# Patient Record
Sex: Female | Born: 1975 | Race: Black or African American | Hispanic: No | Marital: Married | Smoking: Former smoker
Health system: Southern US, Community
[De-identification: ages and names within clinical notes are randomized; demographics above are authoritative.]

## PROBLEM LIST (undated history)

## (undated) DIAGNOSIS — N730 Acute parametritis and pelvic cellulitis: Secondary | ICD-10-CM

## (undated) DIAGNOSIS — M25561 Pain in right knee: Secondary | ICD-10-CM

## (undated) HISTORY — DX: Acute parametritis and pelvic cellulitis: N73.0

## (undated) HISTORY — PX: ECTOPIC PREGNANCY SURGERY: SHX613

## (undated) HISTORY — PX: BREAST SURGERY: SHX581

## (undated) HISTORY — DX: Pain in right knee: M25.561

## (undated) HISTORY — PX: PARTIAL HYSTERECTOMY: SHX80

---

## 2003-08-17 ENCOUNTER — Emergency Department (HOSPITAL_COMMUNITY): Admission: EM | Admit: 2003-08-17 | Discharge: 2003-08-17 | Payer: Self-pay | Admitting: Emergency Medicine

## 2003-08-17 ENCOUNTER — Encounter: Payer: Self-pay | Admitting: Emergency Medicine

## 2003-09-23 ENCOUNTER — Other Ambulatory Visit: Admission: RE | Admit: 2003-09-23 | Discharge: 2003-09-23 | Payer: Self-pay | Admitting: Obstetrics and Gynecology

## 2004-03-16 ENCOUNTER — Inpatient Hospital Stay (HOSPITAL_COMMUNITY): Admission: AD | Admit: 2004-03-16 | Discharge: 2004-03-18 | Payer: Self-pay | Admitting: *Deleted

## 2004-03-16 IMAGING — US US OB COMP +14 WK
1 series · 13 of 28 positions shown · non-contrast
Comparison: none

CLINICAL DATA: Elevated serum glucose.  Spotty prenatal care.  G1 P0.  LMP [DATE].

[Series 1: unknown · 0.27mm/px · 13 of 40 slices shown]
[im 2/40]
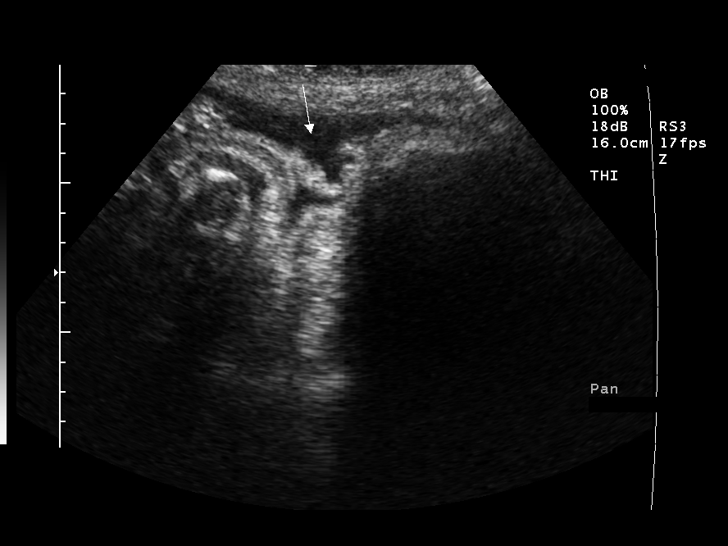
[im 5/40]
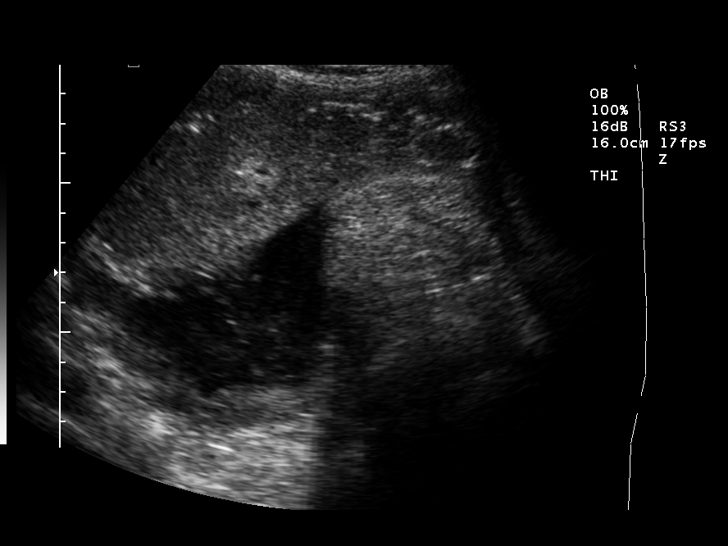
[im 8/40]
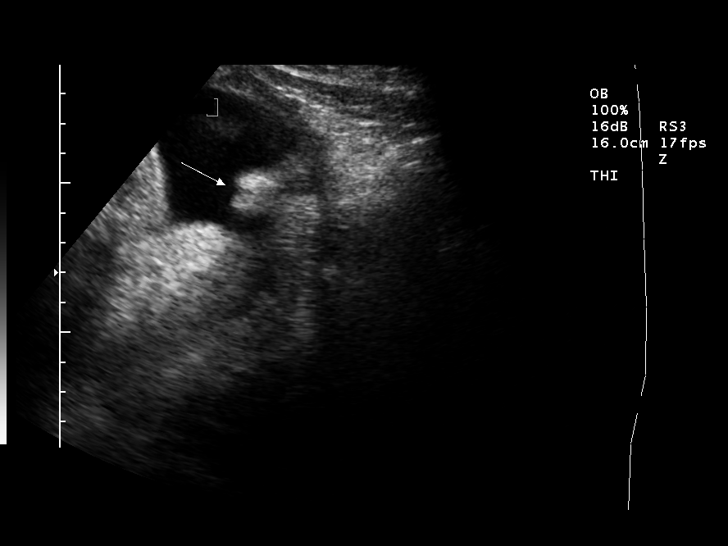
[im 11/40]
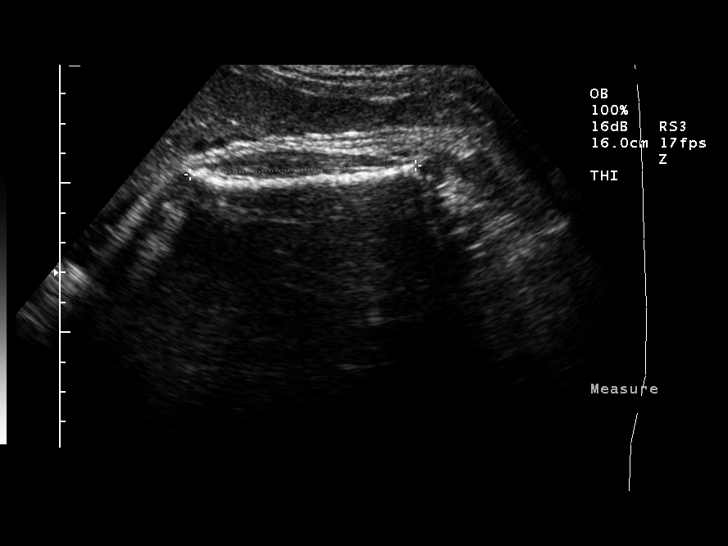
[im 14/40]
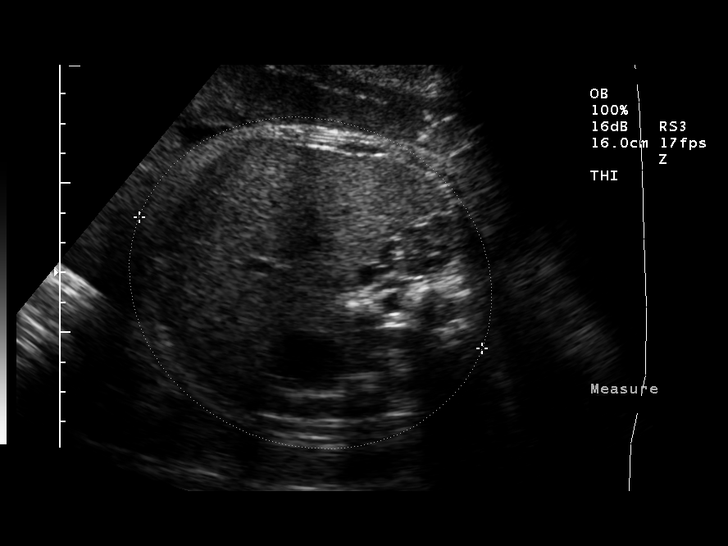
[im 16/40]
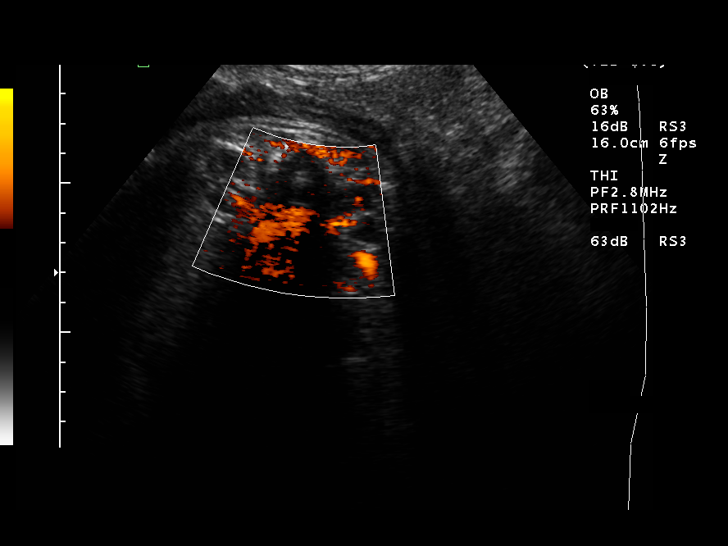
[im 21/40]
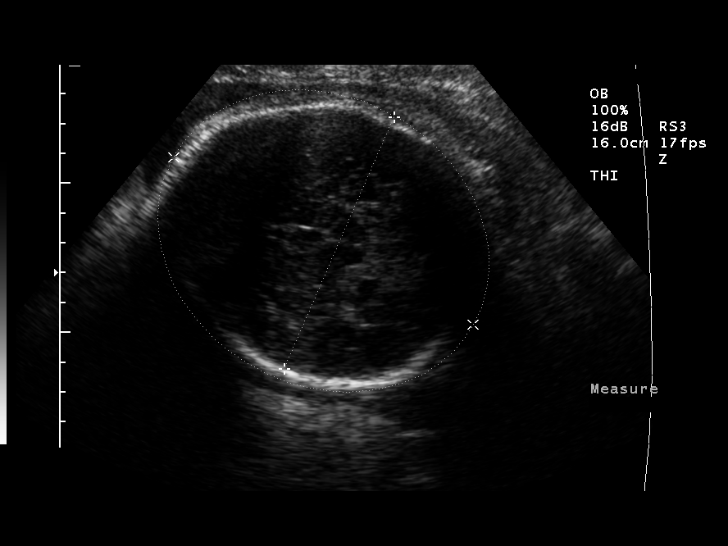
[im 24/40]
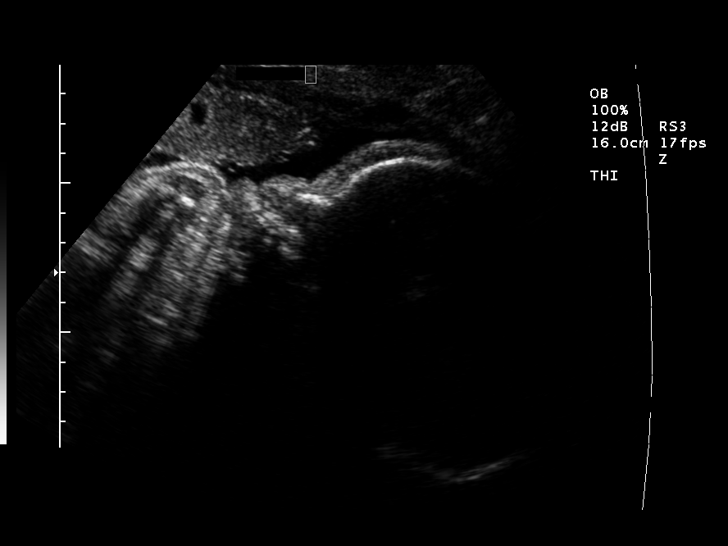
[im 27/40]
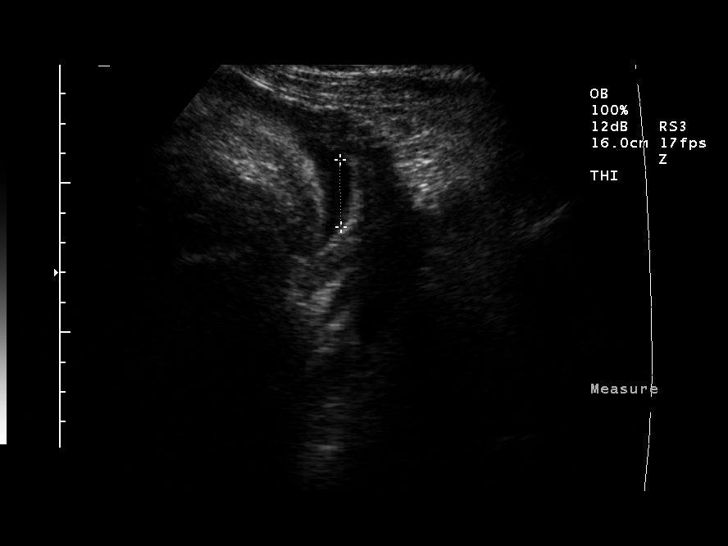
[im 29/40]
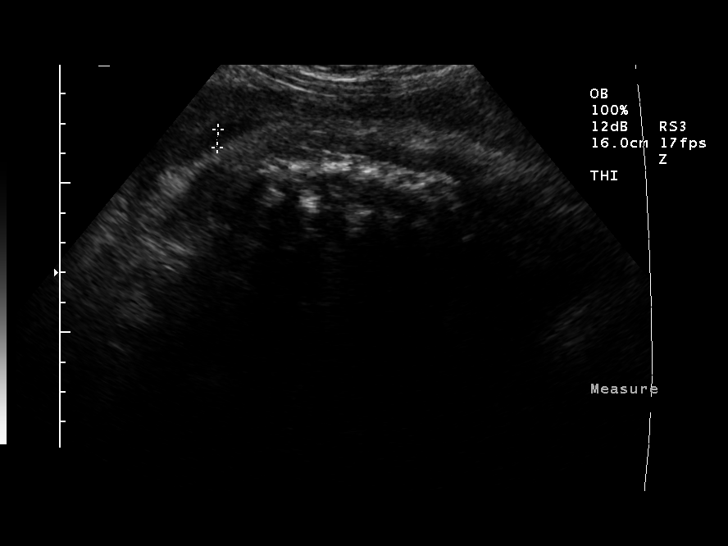
[im 32/40]
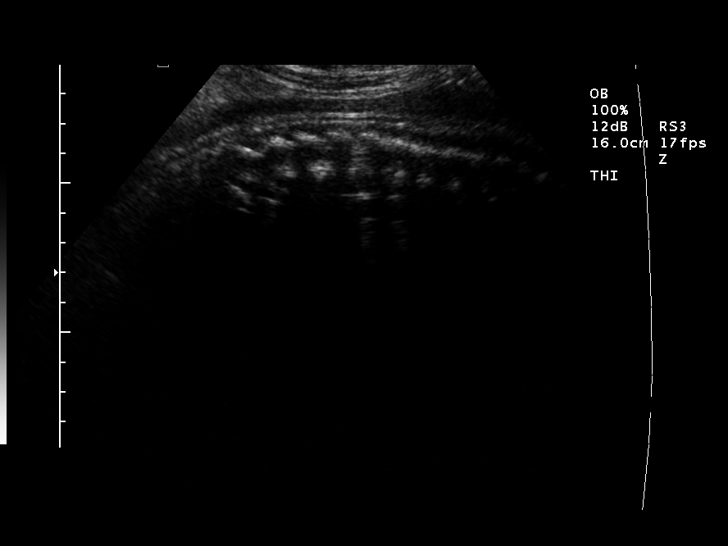
[im 35/40]
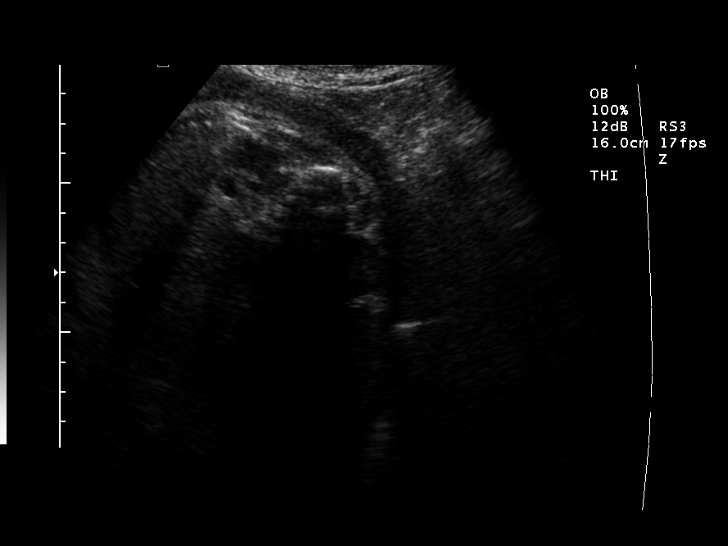
[im 38/40]
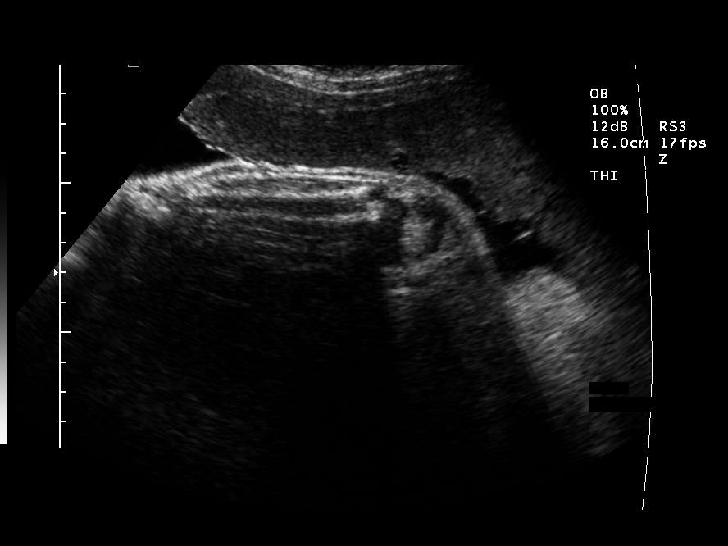

[13 of 28 positions shown; findings below may reference images not displayed]

OBSTETRICAL ULTRASOUND
 Number of Fetuses:  1
 Heart Rate:  162
 Movement:  Yes
 Breathing:  Yes  
 Presentation:  Cephalic
 Placental Location:  Anterior
 Grade:  II
 Previa:  No
 Amniotic Fluid (Subjective):  Normal
 Amniotic Fluid (Objective):   12.5 cm AFI (5th -95th%ile =   7.3 ? 23.9 cm for 38 wks)

 FETAL BIOMETRY
 BPD:   9.2 cm  37 w 3 d
 HC:   32.9 cm  37 w 3 d
 AC:   37.0 cm   41 w 0 d
 FL:    7.5 cm   38 w 1 d

 MEAN GA:  38 w 4 d
 GA BY LMP:    38 w 0 d (assigned)
 EFW:  [LQ] g (H) 90th ? 95th%ile ([LQ] ? [LQ] g) For 38 wks

 FETAL ANATOMY
 Lateral Ventricles:    Not visualized 
 Thalami/CSP:      Not visualized 
 Posterior Fossa:  Not visualized 
 Nuchal Region:    N/A
 Spine:      Visualized 
 4 Chamber Heart on Left:      Visualized 
 Stomach on Left:      Visualized 
 3 Vessel Cord:    Not visualized 
 Cord Insertion site:    Not visualized 
 Kidneys:  Visualized 
 Bladder:  Visualized 
 Extremities:      Limited

 ADDITIONAL ANATOMY VISUALIZED:  Upper lip and female genitalia

 Evaluation limited by:  Fetal position and advanced gestational age 

 MATERNAL FINDINGS
 Cervix: Not evaluated
IMPRESSION: Single living intrauterine fetus in cephalic presentation.  Patient is 38 weeks by LMP dating and measures 38 weeks 4 days today with a fetal weight of [LQ] grams, falling between the 90th and 95th percentile for 38 wks.  Findings raise concern for developing macrosomia.  
 Normal amniotic fluid volume with AFI 12.5 cm. 
 Very limited anatomic survey due to fetal positioning and late gestational age.

 </u12:p>

## 2004-03-22 ENCOUNTER — Encounter: Admission: RE | Admit: 2004-03-22 | Discharge: 2004-03-22 | Payer: Self-pay | Admitting: Obstetrics and Gynecology

## 2004-03-28 ENCOUNTER — Ambulatory Visit (HOSPITAL_COMMUNITY): Admission: RE | Admit: 2004-03-28 | Discharge: 2004-03-28 | Payer: Self-pay | Admitting: Obstetrics and Gynecology

## 2004-03-28 ENCOUNTER — Inpatient Hospital Stay (HOSPITAL_COMMUNITY): Admission: AD | Admit: 2004-03-28 | Discharge: 2004-03-31 | Payer: Self-pay | Admitting: Obstetrics and Gynecology

## 2004-03-28 IMAGING — US US OB FOLLOW-UP
1 series · 13 of 27 positions shown · non-contrast
Comparison: none

CLINICAL DATA: Insulin-dependent diabetes.  Assess fetal growth.

[Series 1: unknown · 0.32mm/px · 13 of 27 slices shown]
[im 2/27]
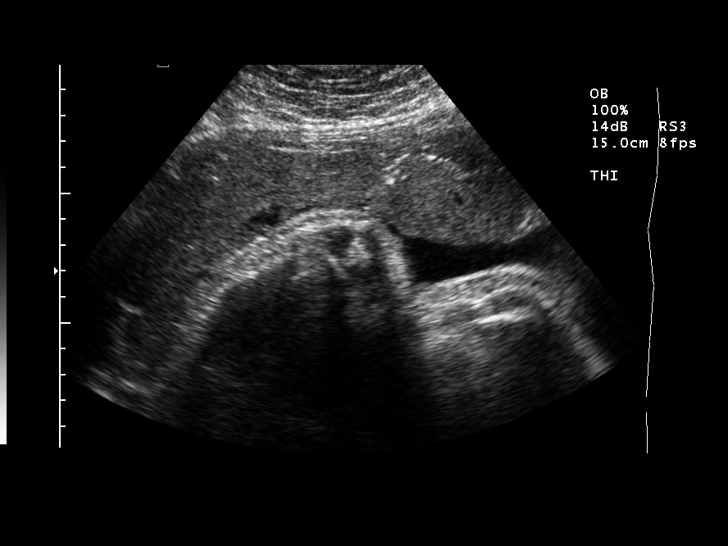
[im 4/27]
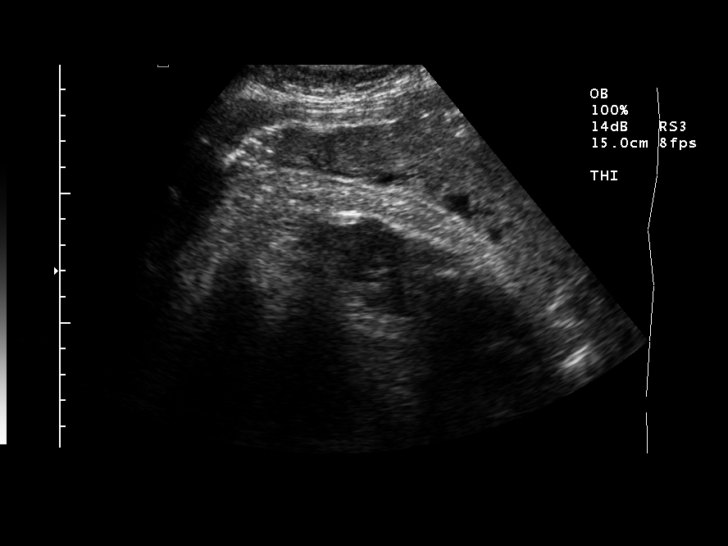
[im 6/27]
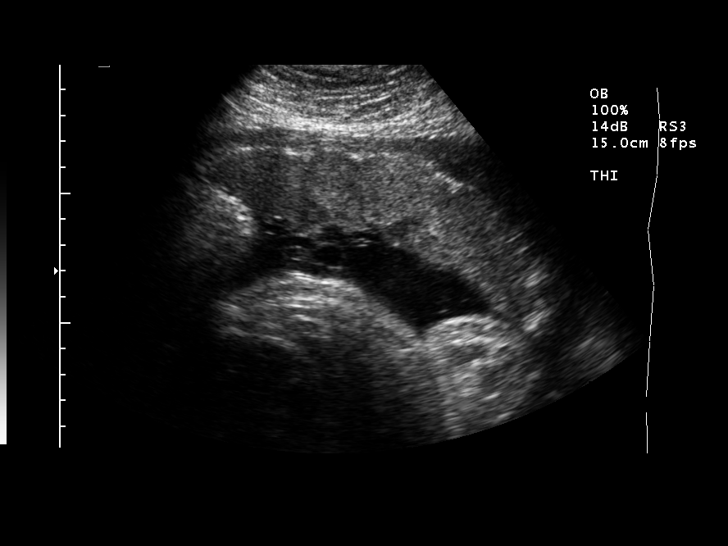
[im 8/27]
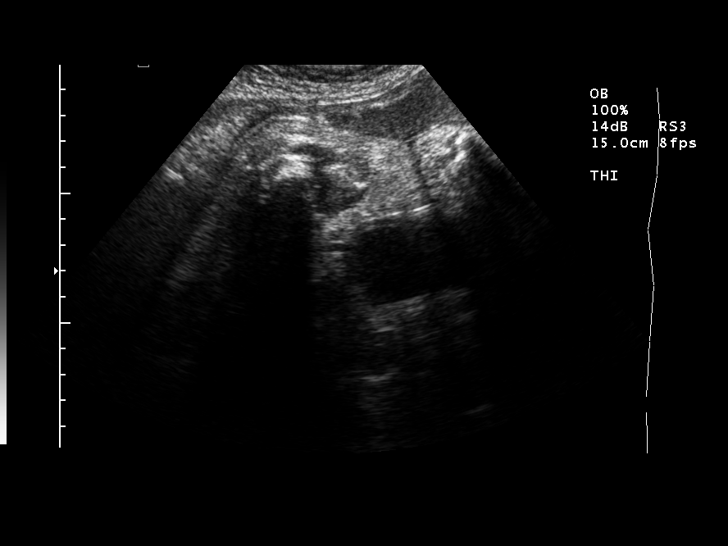
[im 10/27]
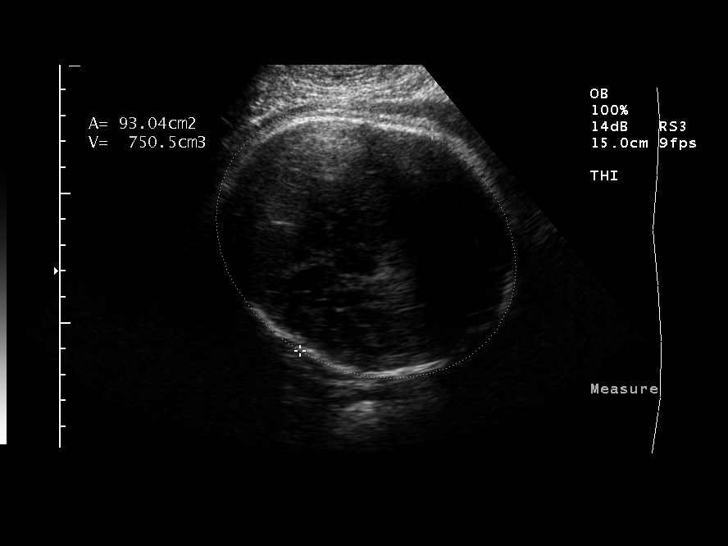
[im 12/27]
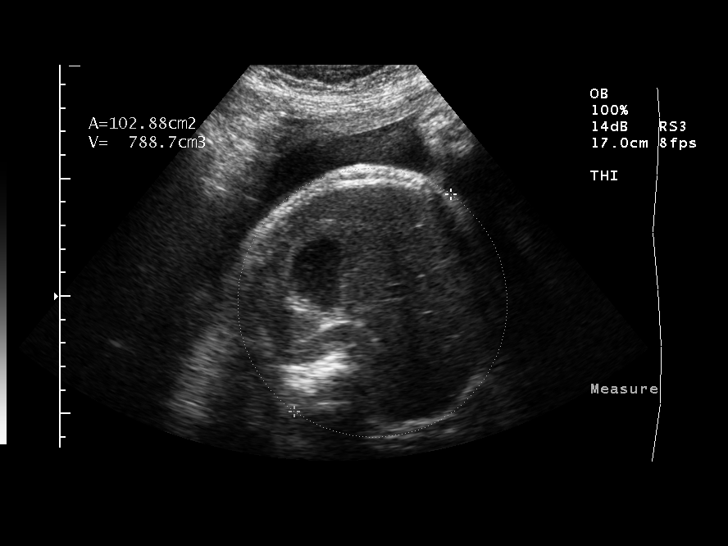
[im 14/27]
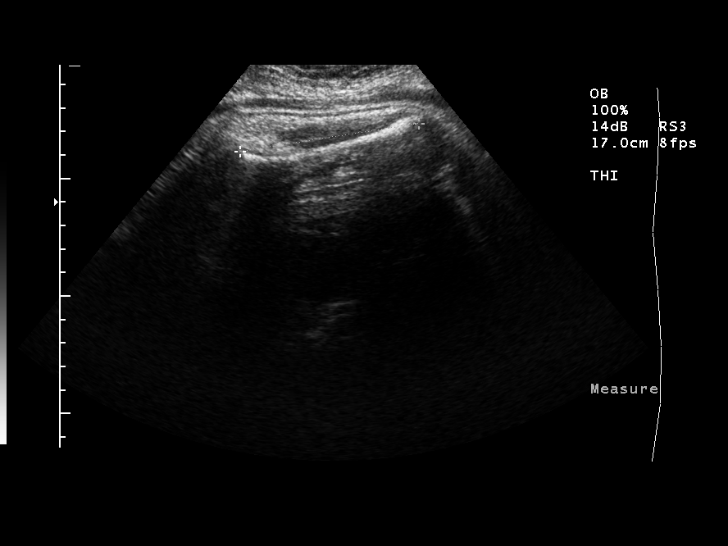
[im 16/27]
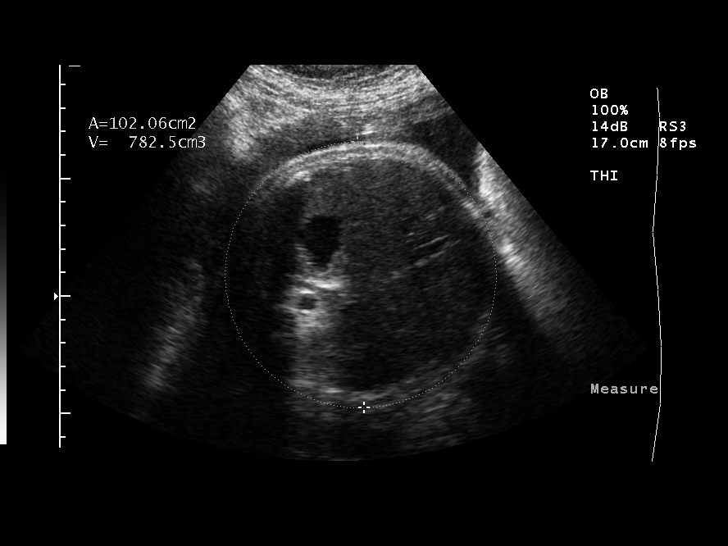
[im 18/27]
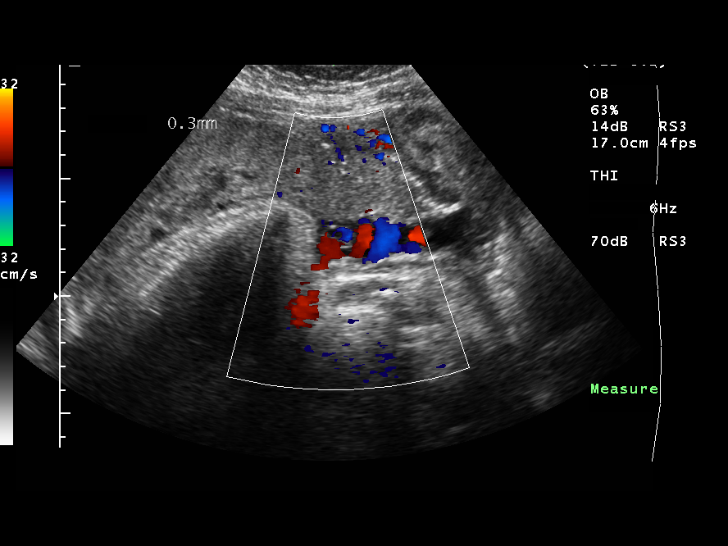
[im 20/27]
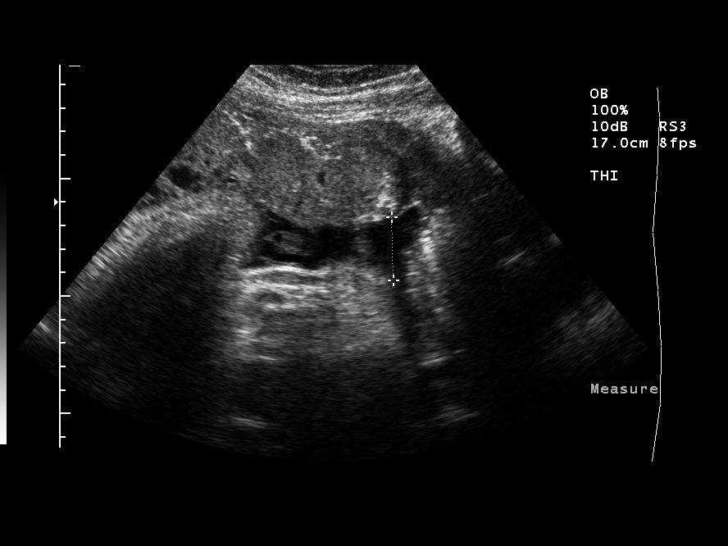
[im 22/27]
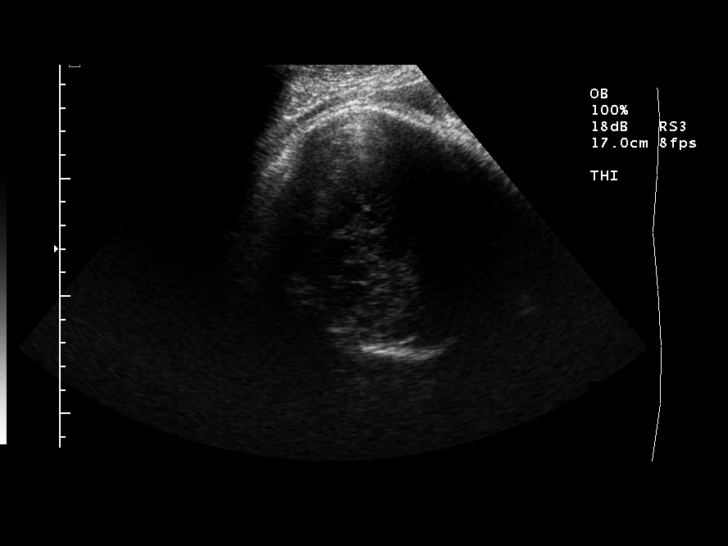
[im 24/27]
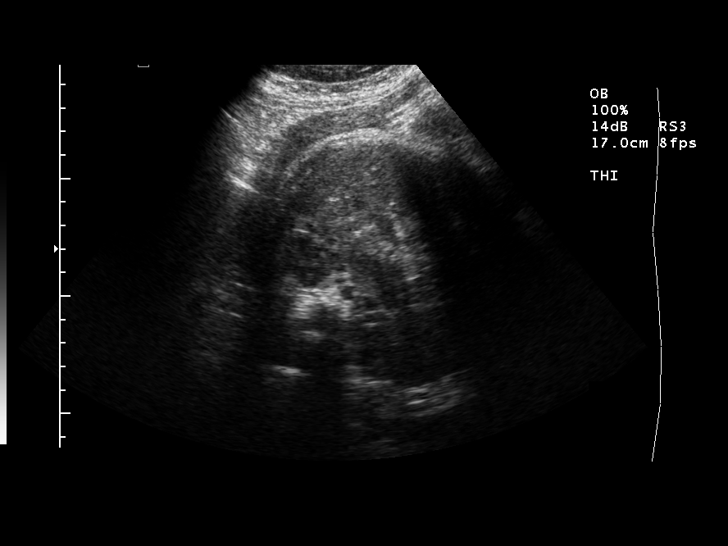
[im 26/27]
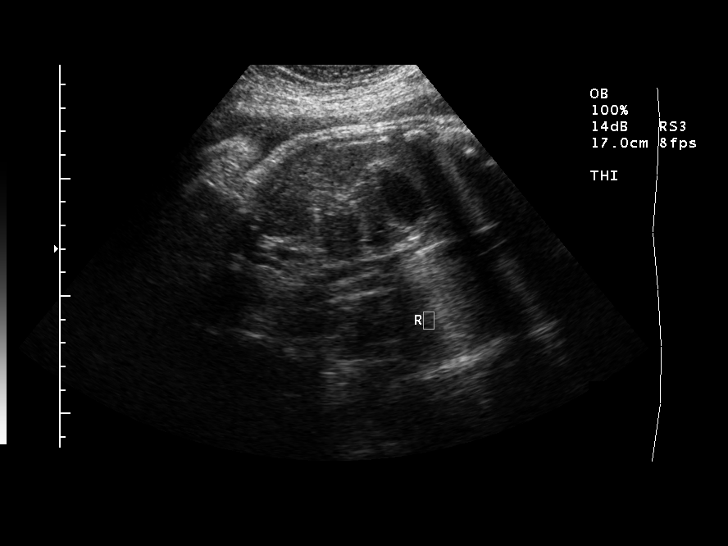

[13 of 27 positions shown; findings below may reference images not displayed]

OBSTETRICAL ULTRASOUND RE-EVALUATION
Number of Fetuses: 1
Heart Rate: 142 BPM
Movement: Yes
Breathing: Yes
Presentation: Vertex
Placental Location: Anterior
Grade: II
Previa: No
Amniotic Fluid (subjective): Decreased
Amniotic Fluid (objective): AFI 6.8 cm ([3Y] %ile = 7.1 to 21.6 cm for 40 weeks)

FETAL BIOMETRY
BPD:9.7 cm, 39W 6D
HC: 34.5 cm, 39W 6D
AC:36.0 cm, 40W 0D
FL: 7.7 cm, 39W 2D
HL:  6.6 cm, 38W 3D
Mean GA: 39W 3D
Fetal indices are within normal limits. 
EFW: [3Y] g (H) [3Y] %ile ([3Y]-[3Y] g) for 40 weeks

FETAL ANATOMY
Lateral Ventricles: not visualized   
Thalami/CSP: not visualized   
Posterior Fossa: not visualized   
Nuchal Region: N/A
Spine: previously seen   
4 Chamber Heart on Left: previously seen 
Stomach on Left: visualized 
3 Vessel Cord: not visualized 
Cord Insertion Site: not visualized 
Kidneys: visualized 
Bladder: visualized 
Extremities: not visualized 

ADDITIONAL ANATOMY VISUALIZED: orbits and diaphragm.

Evaluation limited by: maternal habitus and advanced gestational age.

MATERNAL FINDINGS
Cervix: not evaluated.
IMPRESSION: Single living intrauterine   pregnancy demonstrating an estimated gestational age by ultrasound of 39 weeks and 3 days.  Correlation with expected estimated gestational age by early ultrasound of 39 weeks 5 days suggests appropriate growth.  Today the estimated fetal weight is close to the 75th percentile for a 40-week gestation.  The measurements are felt to be accurate on today?s exam. 
Subjectively and quantitatively slightly decreased amniotic fluid volume.
No late developing fetal anatomic abnormalities are identified associated with the stomach, kidneys, or bladder.  The four-chamber heart view and lateral ventricles could not be assessed due to positioning on today?s exam.
The scan results were called to Dr. CORNIEL?[REDACTED].

## 2005-02-02 ENCOUNTER — Emergency Department (HOSPITAL_COMMUNITY): Admission: EM | Admit: 2005-02-02 | Discharge: 2005-02-02 | Payer: Self-pay | Admitting: Family Medicine

## 2006-02-26 ENCOUNTER — Emergency Department (HOSPITAL_COMMUNITY): Admission: EM | Admit: 2006-02-26 | Discharge: 2006-02-26 | Payer: Self-pay | Admitting: Emergency Medicine

## 2006-11-20 HISTORY — PX: TUBAL LIGATION: SHX77

## 2007-05-22 ENCOUNTER — Inpatient Hospital Stay (HOSPITAL_COMMUNITY): Admission: AD | Admit: 2007-05-22 | Discharge: 2007-05-22 | Payer: Self-pay | Admitting: Obstetrics and Gynecology

## 2007-08-12 ENCOUNTER — Inpatient Hospital Stay (HOSPITAL_COMMUNITY): Admission: AD | Admit: 2007-08-12 | Discharge: 2007-08-15 | Payer: Self-pay | Admitting: Obstetrics and Gynecology

## 2007-10-29 ENCOUNTER — Ambulatory Visit (HOSPITAL_COMMUNITY): Admission: RE | Admit: 2007-10-29 | Discharge: 2007-10-29 | Payer: Self-pay | Admitting: Obstetrics and Gynecology

## 2011-04-04 NOTE — Op Note (Signed)
NAME:  Diana Gray, Diana Gray NO.:  [id number]   MEDICAL RECORD NO.:  [id number]          PATIENT TYPE:  AMB   LOCATION:  SDC                           [facility]   PHYSICIAN:  Osborn Coho, M.D.   DATE OF BIRTH:  02-25-1976   DATE OF PROCEDURE:  10/29/2007  DATE OF DISCHARGE:                               OPERATIVE REPORT   PREOPERATIVE DIAGNOSIS:  Permanent sterilization.   POSTOPERATIVE DIAGNOSIS:  Permanent sterilization.   PROCEDURE:  Laparoscopic bilateral tubal sterilization.   SURGEON:  Osborn Coho, M.D.   ANESTHESIA:  General anesthesia.   FINDINGS:  Normal-appearing bilateral ovaries and fallopian tubes.   FLUIDS:  1500 mL.   URINE OUTPUT:  Greater than 500 mL via straight cath prior to procedure.   ESTIMATED BLOOD LOSS:  Minimal.   COMPLICATIONS:  None.   DESCRIPTION OF PROCEDURE:  The patient was taken to the operating room  where, after the risks, benefits, and alternatives were reviewed with  the patient, patient verbalized understanding and consent signed and  witnessed.  The patient understands that this is a permanent procedure  and declines alternatives and understands that there is a risk of  failure with the possibility of ectopic pregnancy.  The patient was  prepped and draped in normal sterile fashion after being placed under  general anesthesia.  The patient was in the dorsal lithotomy position  and the bladder was emptied with a Foley.  A bivalve speculum was placed  in the patient's vagina and a Hulka placed for intrauterine  manipulation.  Attention was then turned to the abdomen where a 10 mm  incision was made at the umbilicus and Veress needle  introduced into  the intra-abdominal cavity without difficulty.  Pneumoperitoneum  achieved.  A 10 mm trocar was then advanced into the intra-abdominal  cavity and laparoscope introduced.  Fitz/Hugh/Curtis noticed.  Fallopian  tubes were carried out to their fimbriated ends and  cauterized for at  least three consecutive burns in the isthmic region bilaterally.  Pneumoperitoneum was then relieved.  Trocar removed under  direct visualization.  The fascia was repaired with a 0 Vicryl via a  running stitch.  The skin was reapproximated using 3-0 Monocryl via a  subcuticular stitch.  Sponge, lap and needle counts correct.  The  patient tolerated the procedure well and was awaiting transfer to  recovery room in good condition.      Osborn Coho, M.D.  Electronically Signed     AR/MEDQ  D:  10/29/2007  T:  10/29/2007  Job:  [id number]

## 2011-04-04 NOTE — H&P (Signed)
Diana Gray, BRANAN NO.:  [id number]   MEDICAL RECORD NO.:  [id number]          PATIENT TYPE:  INP   LOCATION:  9163                          [facility]   PHYSICIAN:  Janine Limbo, M.D.DATE OF BIRTH:  04-29-1976   DATE OF ADMISSION:  08/12/2007  DATE OF DISCHARGE:                              HISTORY & PHYSICAL   Diana Gray is a 35 year old, single black female, gravida 2, para 1,  0, 0, 1, at [redacted] weeks gestation who presents for induction of labor.  She  denies contractions, leaking or bleeding, no signs and symptoms of PIH.  Her pregnancy has been followed by the [facility] MD  [facility] and has been remarkable for:  1. History of gestational diabetes.  2. History of STDs.  3. Smoker.  4. Positive RPR.  5. Elevated body mass index.  6. History of oligohydramnios.  7. Group B strep positive.   Her prenatal labs were collected on February 07, 2007.  Hemoglobin 13.4,  hematocrit 40, platelets 386,000.  Blood type 0 positive, antibody  negative, sickle cell trait negative, RPR reactive, rubella immune,  hepatitis B surface antigen negative, HIV nonreactive, gonorrhea  negative, chlamydia positive.  Pap smear within normal limits.  TPPA  reactive, titer of 1-2.  One hour Glucola from Apr 08, 2007 was 100.  RPR at that time was nonreactive.  GC and chlamydia from May 02, 2007  were both negative.  Hemoglobin A1c from May 02, 2007 was 6.  Group B  strep from June 01, 2007 was positive.  Chlamydia from that same date  was also positive.  One hour Glucola from June 03, 2007 was 15.  GC and  chlamydia from July 15, 2007 were both negative.   HISTORY OF PRESENT PREGNANCY:  The patient presented for care at [facility] on February 07, 2007 at [redacted] weeks gestation.  The patient started  monitoring CBGs at that point due her history of gestational diabetes  with her first pregnancy.  The patient also a positive RPR with history  of positive  RPR in 2005.  The patient had positive chlamydia at that new  OB visit and she was treated at that time.  Anatomy ultrasound at [redacted]  weeks gestation shows growth not consistent with previous dating with  best [facility] as August 19, 2007.  Test of cure for chlamydia was done on  Apr 08, 2007 at [redacted] weeks gestation and was negative.  The patient had an  early Glucola that was also negative as well as a Glucola at 29 weeks  that was negative.  The patient was tested for group B strep which was  positive around 28 weeks.  She also was positive for chlamydia again at  28 weeks.  She was treated and test of cures were negative from July 18, 2007.  The rest of her prenatal care has been unremarkable.  She has  remained normotensive and most recently was stating that she was tired  of being pregnant and was requesting induction of labor.   OB HISTORY:  She is gravida 2,  para 1, 0, 0, 1.  In May of 2005 she had  a vaginal delivery of a female infant weighing 8 pounds 6 ounces at 27-  5/[redacted] weeks gestation after 5 hours in labor.  She had an epidural for  anesthesia.  She had gestational diabetes with that pregnancy.  There  were no complications with the delivery.  A second pregnancy was with a  different paternity.   PAST MEDICAL HISTORY:  She has no medication allergies.  She experienced  menarche at the age of 83 with 28 day cycles lasting 4 days.  She had  oligo, gestational diabetes with her first pregnancy.  She was treated  for gonorrhea in September 2004 as well as Trichomonas.  She reports  having had the usual childhood illnesses.  She has had a history of  positive RPR.  She has had cystitis times 1.   PAST SURGICAL HISTORY:  Negative.   FAMILY MEDICAL HISTORY:  Father with MI, father with chronic  hypertension.  Sister with anemia.  Father and maternal uncle with  diabetes.  Sister with history of migraines.  Genetic history is  negative.   SOCIAL HISTORY:  The patient is single.   Father of the baby is not  involved.  She has 10 years of education and is employed full time as a  Advertising copywriter.  She denies any alcohol or illicit drug use with the  pregnancy although she smoked approximately 4 cigarettes per day during  the pregnancy.   OBJECTIVE:  VITAL SIGNS:  Stable, she is afebrile.  HEENT:  Grossly within normal limits.  CHEST:  Clear to auscultation.  HEART:  Regular rate and rhythm.  ABDOMEN:  Gravid and contoured with fundal height extending  approximately 39 cm above the pubic symphysis.  Fetal heart rate is  reactive and reassuring.  Contractions are occasional and mild.  Cervix  was 1 cm per her last office exam.  Vertex was confirmed by bedside  ultrasound tonight.  EXTREMITIES:  Within normal limits.   ASSESSMENT:  1. Intrauterine pregnancy at term.  2. Unfavorable cervix.   PLAN:  1. Admit to birthing suites.  2. Routine M.D. orders.  3. Plan cervical ripening tonight followed by pitocin in the morning.      [name], C.N.M.      Janine Limbo, M.D.  Electronically Signed    KS/MEDQ  D:  08/12/2007  T:  08/13/2007  Job:  [id number]

## 2011-04-07 NOTE — H&P (Signed)
NAME:  Diana Gray, HUDMAN                         ACCOUNT NO.:  [id number]   MEDICAL RECORD NO.:  [id number]                   PATIENT TYPE:  INP   LOCATION:  9149                                 [facility]   PHYSICIAN:  Naima A. Dillard, M.D.              DATE OF BIRTH:  12/24/1975   DATE OF ADMISSION:  DATE OF DISCHARGE:                                HISTORY & PHYSICAL   HISTORY OF PRESENT ILLNESS:  Diana Gray is a 35 year old gravida 1 para 0  at 38 weeks by first trimester ultrasound who was seen at the office today  with a fasting blood sugar of 269.  The patient's care had been remarkable  for being intermittent, with transferring back and forth from [place]  [facility] to [place].  She had had 6-7 visits total for her prenatal care  with four of those being at [facility].  She was seen at our office  on March 09, 2004 with a blood sugar of 364 after drinking two sodas so she  was brought back in today for a fasting blood sugar of 269.  She is  therefore to be admitted to [facility] for diabetic management,  complete OB ultrasound, and monitoring.  Pregnancy has been remarkable for:  1. Intermittent prenatal care.  2. First trimester chlamydia which was treated with negative test of cure.  3. LATEX allergy.  4. Positive RPR with treatment in early January, now with a nonreactive RPR.  5. Smoker.  6. Positive group B strep.  7. Current yeast infection.  8. Questionable history of vulvar blisters last week.   PRENATAL LABORATORY DATA:  Blood type is O positive, Rh antibody negative.  VDRL was reactive in January.  She was treated at that time.  She had repeat  testing on April 7 which showed a titer of 4.  She had another titer done on  April 13 which was nonreactive.  Hepatitis B was negative.  HIV was  negative.  GC and chlamydia cultures were negative at 36 weeks.  Positive  beta strep culture was noted at 36 weeks.  Glucose challenge was noted at  her  other office in [place] to be negative at 105; this was done at 28 weeks at  her other office.  EDC of Mar 30, 2004 was established by ultrasound that  was done on August 17, 2003.  The patient also had a yeast infection  diagnosed today.   HISTORY OF PRESENT PREGNANCY:  The patient entered care at [facility] at 13 weeks.  She had bacterial vaginosis diagnosed at her first visit.  From that visit on November 3 we did not see her again until December 23.  She reported she had moved to [place].  On December 23 she walked in  unannounced but reports she decided not to move.  From her visit on December  23 she was not seen  again until April 7.  She reported she had moved back to  [place].  She had had records transferred showing a normal 1-hour Glucola on  February 24.  She had also had an ultrasound on March 2 that showed an  estimated fetal weight between 2166 g and 2254 g at 33 weeks with an [facility] of  Mar 26, 2004.  Upon return to [facility] she was noted to have had a  positive RPR titer in January and she did receive three penicillin  injections.  RPR was sent on April 7 showing a titer of 4.  GC, chlamydia,  and group B strep culture were also collected on April 7 and she also had a  positive wet prep for bacterial vaginosis.  On her follow-up visit on April  13 she had a negative RPR.  She then was seen again on April 20.  She had 3+  glycosuria that day and had a Dextrostix of 364.  She reported she drank two  sodas on the way to the office.  She was then scheduled for a fasting blood  sugar today which was 269.  She also reported per telephone call on April 21  severe vaginal irritation with blisters.  She was recommended to be seen;  however, she declined due to transportation.  She was not seen again until  today on April 27 with the previously-noted issues.   OBSTETRICAL HISTORY:  The patient is a primigravida.   MEDICAL HISTORY:  In October 2004 she was treated for  chlamydia.  At the  same time she was treated for Trichomonas.  She does have a history of yeast  infections.  She is allergic to PRENATAL VITAMINS which cause a rash, and  LATEX GLOVES.  She has had one history of UTIs.   FAMILY HISTORY:  Her father had an MI.  He has also had hypertension and  diabetes.  She also reports there is diabetes on the other side of her  family.  Her sister has anemia.  Her sister also has renal failure and is on  dialysis.  Her sister has some type of cancer.  Her father is a smoker.   GENETIC HISTORY:  Unremarkable.   SOCIAL HISTORY:  The patient is single.  The father of the baby is  intermittently involved.  The patient has a 10-year education.  She is  employed at a nursing home.  The father of the baby has a 12-year education.  He is unemployed.  The patient has been followed by the certified nurse  midwife service but she is now to be transferred to the physician service  secondary to gestational diabetes.  The patient denies any alcohol, drug, or  tobacco use during this pregnancy.   PHYSICAL EXAMINATION:  VITAL SIGNS:  Stable, blood pressure is 120/80,  weight today is 263 pounds for a total of 19 pounds weight gain.  HEENT:  Within normal limits.  LUNGS:  Bilateral breath sounds are clear.  HEART:  Regular rate and rhythm without murmur.  BREAST:  Soft and nontender.  ABDOMEN:  Fundal height is approximately 37 cm.  Estimated fetal weight is 6  to 6.5 pounds.  There are no contractions palpated.  Fetal heart rate is in  the 150s by Doppler with audible accelerations.  PELVIC:  There is copious greenish-yellow discharge adherent to the perineum  and the vaginal walls.  This shows positive yeast.  There is no evidence of  any blisters or  lesions on the perineum or anal area or intravaginally or on  the cervix.  Cervix is posterior, closed, 50%, vertex at a -1 station.  EXTREMITIES:  Deep tendon reflexes are 2+ without clonus.  There is a  trace edema noted.   Urine sample today shows 3+ glucose and 2+ ketones.   IMPRESSION:  1. Intrauterine pregnancy at 38 weeks.  2. Irregular prenatal care.  3. Gestational diabetes with late diagnosis.  4. LATEX ALLERGY.  5. First trimester chlamydia with a negative test of cure.  6. Positive beta streptococcus.  7. Current yeast infection.  8. History of positive RPR with most recent nonreactive test.   PLAN:  1. Admit to the [facility] per consult with Dr. Jaymes Graff as attending physician.  2. Plan glucose monitoring per Dr. Redmond Baseman preference.  3. Complete OB ultrasound for size, dates, and fluid.  4. Weigh daily.  5. A 2200 calorie ADA diet.  6. Dr. Normand Sloop will determine further plan of care.     Renaldo Reel Emilee Hero, C.N.M.                   Naima A. Normand Sloop, M.D.    Leeanne Mannan  D:  03/16/2004  T:  03/16/2004  Job:  [id number]

## 2011-04-07 NOTE — H&P (Signed)
NAME:  Diana Gray, Diana Gray NO.:  [id number]   MEDICAL RECORD NO.:  [id number]                   PATIENT TYPE:  INP   LOCATION:  9166                                 [facility]   PHYSICIAN:  Janine Limbo, M.D.            DATE OF BIRTH:  May 06, 1976   DATE OF ADMISSION:  03/28/2004  DATE OF DISCHARGE:                                HISTORY & PHYSICAL   HISTORY OF PRESENT ILLNESS:  This is a 35 year old, gravida 1, para 0, at 66-  5/7 weeks, who presents for elective induction of labor secondary to  gestational diabetes and new onset of oligohydramnios.  She was originally  scheduled for tomorrow for induction.  Her pregnancy has been followed by  the nurse midwife service and now by the M.D. service and remarkable for:  1.  Unsure dates.  2.  First trimester chlamydia.  3.  Smoker.  4.  Latex  allergy.  5.  Positive RPR (treated).  6.  Gestational diabetes.  7.  Oligohydramnios.  8.  Group B Streptococcus positive.   OBSTETRICAL HISTORY:  The patient is primigravida.   MEDICAL HISTORY:  Remarkable for:  1. Chlamydia in October and Trichomonas in October, both of which were     treated.  2. History of yeast infections.  3. Childhood varicella.   FAMILY HISTORY:  Remarkable for father with MI and hypertension, a sister  with anemia, father with diabetes, a sister with renal failure, and a sister  with an unknown type of cancer.   GENETIC HISTORY:  Unremarkable.   SURGICAL HISTORY:  Negative.   ALLERGIES:  1. PENICILLIN causes a rash.  2. LATEX GLOVES.   SOCIAL HISTORY:  The patient is single.  The father of the baby is Madelin Headings.  She works in a nursing home.  She does not report a religious  affiliation.  She denies alcohol, tobacco, or drug use.  She does report the  use of cigarettes before positive pregnancy test.   PRENATAL LABORATORIES:  Hemoglobin 12.6, platelets 315.  Blood type O  positive.  Antibody screen negative.  RPR  reactive in January.  Rubella  immune.  Hepatitis negative.  HIV negative.  First Glucola 105.  Gonorrhea  negative.  Chlamydia negative.  Then she had a third trimester fasting blood  sugar of 269.   OBJECTIVE DATA:  VITAL SIGNS:  Stable.  Afebrile.  HEENT:  Within normal limits.  NECK:  Thyroid normal.  Not enlarged.  CHEST:  Clear to auscultation.  HEART:  Regular rate and rhythm.  ABDOMEN:  Gravid at 40 cm.  Vertex to Leopold's.  PELVIC:  EMF shows reactive fetal heart rate with no contractions.  Ultrasound shows 50-75% growth with an AFI of 6.8 and a grade 2 placenta.  The cervix is 1 cm, long, -3, soft, and vertex.  EXTREMITIES:  Within normal limits.   ASSESSMENT:  1. Intrauterine pregnancy  at 39-5/7 weeks.  2. Gestational diabetes.  3. Oligohydramnios.  4. History of RPR and chlamydia.  5. Latex allergy.  6. Limited prenatal care.   PLAN:  1. Admit to birthing suites per Dr. Stefano Gaul.  2. Routine M.D. orders.  3. Cytotec induction.  4. Blood sugar monitoring per Dr. Stefano Gaul.     Marie L. Williams, C.N.M.                 Janine Limbo, M.D.    MLW/MEDQ  D:  03/28/2004  T:  03/28/2004  Job:  [id number]

## 2011-04-07 NOTE — H&P (Signed)
NAME:  MIRTIE, BASTYR                         ACCOUNT NO.:  [id number]   MEDICAL RECORD NO.:  [id number]                   PATIENT TYPE:  INP   LOCATION:  9153                                 [facility]   PHYSICIAN:  Naima A. Dillard, M.D.              DATE OF BIRTH:  Mar 20, 1976   DATE OF ADMISSION:  03/16/2004  DATE OF DISCHARGE:                                HISTORY & PHYSICAL   HISTORY OF PRESENT ILLNESS:  Ms. Rayner is a 35 year old, gravida 1, para  0, at 38 weeks.  [facility] Mar 30, 2004 by ultrasound.  She presented to the  office of [facility] today, and was diagnosed with a yeast infection.  Her fasting  blood sugar at that time was greater than 200.  Her blood sugar on admission  today is 338.  She has not eaten yet today.  She did have a 1-hour Glucola,  which was within normal limits.  The patient has had prenatal care here and  in [place], and was just recently diagnosed with diabetes.  She is not having  any contractions, cramping, or pain.  Her baby is moving without any  changes.  She is not having any vaginal bleeding or leaking of fluid.   This patient's prenatal care began at the office of [facility] on September 23, 2003  at approximately [redacted] weeks gestation by ultrasound, and again, her prenatal  care has been incomplete, both here and in [place], and she was seen in October  and in December and in April in our office.  Therefore, her prenatal care  has been limited.   MEDICAL HISTORY:  1. Remarkable for questionable LMP.  2. She had positive Chlamydia treated on August 28, 2003.  Also positive     Trichomonas.  These were both in the first trimester.  3. The patient reports having a positive RPR in January, and received     treatment.  Her RPR will be checked again on admission to the hospital.     Rica Koyanagi, C.N.M.               Naima A. Normand Sloop, M.D.    SDM/MEDQ  D:  03/16/2004  T:  03/16/2004  Job:  [id number]

## 2011-04-07 NOTE — H&P (Signed)
NAME:  Diana Gray, Diana Gray                         ACCOUNT NO.:  [id number]   MEDICAL RECORD NO.:  [id number]                   PATIENT TYPE:  INP   LOCATION:  9149                                 FACILITY:  [facility]   PHYSICIAN:  Naima A. Dillard, M.D.              DATE OF BIRTH:  January 29, 1976   DATE OF ADMISSION:  03/16/2004  DATE OF DISCHARGE:                                HISTORY & PHYSICAL   HISTORY OF PRESENT ILLNESS:  The patient is a 35 year old gravida 1, para 0  at 38 weeks by first trimester ultrasound, who presented to the office today  with a fasting blood sugar of 269.  The patient's prenatal course has been  somewhat limited.  She had begun seeing [place] at [facility] at 13  weeks.  She then had several missed appointments and then had moved out of  town to [place].  She did receive some care in [place], returned to [place] at 20  weeks, but then was not seen from November 12, 2003, until April 7, at 35  weeks when she moved back to [place].  Since that time, she has had a  visit on April 7, April 13, April 20, and today.  At her last visit, she had  a Dextrostix of 364 following 3+ glycosuria.  She did report she had two  sugary sodas on the way to the office, but then she was scheduled for a  fasting today.   She is therefore to be admitted to [facility] for diabetic management.     Renaldo Reel Emilee Hero, C.N.M.                   Naima A. Normand Sloop, M.D.    Diana Gray  D:  03/16/2004  T:  03/16/2004  Job:  [id number]

## 2011-04-07 NOTE — Discharge Summary (Signed)
NAME:  Diana Gray, OSTERMANN                         ACCOUNT NO.:  [id number]   MEDICAL RECORD NO.:  [id number]                   PATIENT TYPE:  INP   LOCATION:  9153                                 [facility]ILITY:  [facility]   PHYSICIAN:  Naima A. Dillard, M.D.              DATE OF BIRTH:  08-09-1976   DATE OF ADMISSION:  03/16/2004  DATE OF DISCHARGE:                                 DISCHARGE SUMMARY   ADMISSION DIAGNOSES:  1. Intrauterine pregnancy at [redacted] weeks gestation.  2. Gestational diabetes requiring insulin and currently out of control.  3. LATEX allergy.  4. Scant prenatal care.  5. Positive group B streptococcus.  6. First trimester chlamydia with negative follow-up.  7. Yeast infection currently.   DISCHARGE DIAGNOSES:  1. Intrauterine pregnancy at [redacted] weeks gestation.  2. Gestational diabetes requiring insulin and currently out of control.  3. LATEX allergy.  4. Scant prenatal care.  5. Positive group B streptococcus.  6. First trimester chlamydia with negative follow-up.  7. Yeast infection currently.  8. Stabilizing diabetes with plan for home care follow-up.   PROCEDURES THIS ADMISSION:  None.   HOSPITAL COURSE:  Diana Gray is a 35 year old single black female gravida  1 para 0 with an EDC of Mar 30, 2004 by first trimester ultrasound who  presented to the office on March 16, 2004 with a fasting blood sugar greater  than 200 and a random blood sugar of 338.  That was even prior to eating on  that day.  Her 1-hour Glucola was within normal range at the time but she  has had scant prenatal care between [facility] and [place], recently moving  back to [place].  She was admitted on March 16, 2004 with this  elevated blood sugar for blood sugar management and has been started on  insulin therapy.  She is also instructed on self-administration of her  insulin and is doing that by herself now.  Her blood sugars are now under  good control and she is felt to be ready for  discharge and the patient  desires discharge today.   DISCHARGE INSTRUCTIONS:  1. She is to check her blood sugars prior to eating in the morning - a     fasting blood sugar, and 2 hours after every meal for a total of four     sticks during the day.  2. She is to give herself 48 units of NPH insulin and 24 units of regular     insulin with her breakfast.  She is to give herself 25 units of regular     insulin with her dinner and 18 units of NPH insulin at bedtime or around     10 p.m.  3. She also has a sliding scale of insulin to give 4 units of regular     insulin for blood sugars of 121-140, 6 units of regular insulin for blood  sugars of 141-160, 8 units of regular insulin for blood sugars of 161-     180, 10 units of regular insulin for blood sugars of 181-200, and if her     blood sugars are greater than 200 she is to call the on-call physician.     If her blood sugars are under 50 she is also to call the on-call     physician.  4. She is to eat a diabetic diet of no greater than 2400 calories a day and     has been instructed per dietician.  5. She is also to call for any signs or symptoms of labor and has also been     instructed in that.   DISCHARGE FOLLOW-UP:  Monday, Mar 21, 2004 at 11:30 a.m. with Dr. Dierdre Forth.  She also has an appointment at [facility] nutrition management  center on Mar 22, 2004 at 8:30 a.m.   DISCHARGE LABORATORY DATA:  Her hemoglobin is 14.5, her wbc count is 9.7,  her platelets are 372.  Her sodium was 135, her BUN is 9, creatinine is 0.7,  and her potassium is 3.6.  Her syphilis is nonreactive.  Uric acid is 6.4,  LDH is 133, SGOT is 20, SGPT is 18.   DISCHARGE STATUS:  Stable.     Concha Pyo. Duplantis, C.N.M.              Naima A. Normand Sloop, M.D.    SJD/MEDQ  D:  03/18/2004  T:  03/18/2004  Job:  [id number]

## 2011-08-28 LAB — CBC
HCT: 40.7
MCHC: 34
MCV: 90.7
WBC: 7.7

## 2011-08-31 LAB — CBC
Hemoglobin: 12.1
MCHC: 34.1
MCV: 90.9
RBC: 3.89
WBC: 10.2
WBC: 10.7 — ABNORMAL HIGH

## 2011-09-05 LAB — URINALYSIS, ROUTINE W REFLEX MICROSCOPIC
Glucose, UA: NEGATIVE
Hgb urine dipstick: NEGATIVE
Ketones, ur: 15 — AB
Nitrite: NEGATIVE
Specific Gravity, Urine: 1.01
Urobilinogen, UA: 0.2

## 2011-09-05 LAB — WET PREP, GENITAL

## 2011-09-05 LAB — STREP B DNA PROBE

## 2011-09-05 LAB — GC/CHLAMYDIA PROBE AMP, GENITAL
Chlamydia, DNA Probe: POSITIVE — AB
GC Probe Amp, Genital: NEGATIVE

## 2011-09-05 LAB — URINE MICROSCOPIC-ADD ON

## 2011-11-21 HISTORY — PX: DILATION AND CURETTAGE OF UTERUS: SHX78

## 2011-11-21 HISTORY — PX: ECTOPIC PREGNANCY SURGERY: SHX613

## 2012-11-20 HISTORY — PX: BREAST SURGERY: SHX581

## 2013-11-20 HISTORY — PX: ECTOPIC PREGNANCY SURGERY: SHX613

## 2013-11-20 HISTORY — PX: DILATION AND CURETTAGE OF UTERUS: SHX78

## 2013-11-20 HISTORY — PX: OVARIAN CYST REMOVAL: SHX89

## 2019-01-03 ENCOUNTER — Encounter (HOSPITAL_COMMUNITY): Payer: Self-pay | Admitting: Emergency Medicine

## 2019-01-03 ENCOUNTER — Ambulatory Visit (HOSPITAL_COMMUNITY)
Admission: EM | Admit: 2019-01-03 | Discharge: 2019-01-03 | Disposition: A | Discharge status: Home / Self Care | Payer: Medicaid Other | Attending: Family Medicine | Admitting: Family Medicine

## 2019-01-03 DIAGNOSIS — M546 Pain in thoracic spine: Secondary | ICD-10-CM | POA: Diagnosis not present

## 2019-01-03 DIAGNOSIS — G8929 Other chronic pain: Secondary | ICD-10-CM

## 2019-01-03 LAB — POCT URINALYSIS DIP (DEVICE)
Bilirubin Urine: NEGATIVE
Glucose, UA: NEGATIVE mg/dL
HGB URINE DIPSTICK: NEGATIVE
KETONES UR: NEGATIVE mg/dL
Nitrite: NEGATIVE
PH: 7 (ref 5.0–8.0)
Protein, ur: NEGATIVE mg/dL
SPECIFIC GRAVITY, URINE: 1.02 (ref 1.005–1.030)
Urobilinogen, UA: 1 mg/dL (ref 0.0–1.0)

## 2019-01-03 MED ORDER — CYCLOBENZAPRINE HCL 5 MG PO TABS: 5.0000 mg | ORAL_TABLET | Freq: Every day | ORAL | 0 refills | Status: DC

## 2019-01-03 MED ORDER — PREDNISONE 20 MG PO TABS: ORAL_TABLET | ORAL | 0 refills | Status: DC

## 2019-01-03 MED ORDER — CEPHALEXIN 500 MG PO CAPS: 500.0000 mg | ORAL_CAPSULE | Freq: Four times a day (QID) | ORAL | 0 refills | Status: DC

## 2019-01-03 NOTE — ED Provider Notes (Signed)
[facility]    CSN: [id number] Arrival date & time: 01/03/19  1215     History   Chief Complaint Chief Complaint  Patient presents with  . Back Pain    HPI Diana Gray is a 43 y.o. female.   Pt here for lower back pain into legs worse with movement.  Has chronic low back pain normal 2/10. Yesterday onset of sharp back pain going up spine 7-8/10 with leg soreness. Denies loss of bowel or bladder function. Reports urinary urgency and frequency without painful urination.      History reviewed. No pertinent past medical history.  There are no active problems to display for this patient.   History reviewed. No pertinent surgical history.  OB History   No obstetric history on file.      Home Medications    Prior to Admission medications   Medication Sig Start Date End Date Taking? Authorizing Provider  cephALEXin (KEFLEX) 500 MG capsule Take 1 capsule (500 mg total) by mouth 4 (four) times daily. 01/03/19   Robyn Haber, MD  cyclobenzaprine (FLEXERIL) 5 MG tablet Take 1 tablet (5 mg total) by mouth at bedtime. 01/03/19   Robyn Haber, MD  predniSONE (DELTASONE) 20 MG tablet One daily with food 01/03/19   Robyn Haber, MD    Family History History reviewed. No pertinent family history.  Social History Social History   Tobacco Use  . Smoking status: Never Smoker  . Smokeless tobacco: Never Used  Substance Use Topics  . Alcohol use: Never    Frequency: Never  . Drug use: Never     Allergies   Patient has no known allergies.   Review of Systems Review of Systems  Constitutional: Negative for chills and fatigue.  Genitourinary: Positive for frequency and urgency. Negative for difficulty urinating and dysuria.  Musculoskeletal: Positive for back pain and gait problem.     Physical Exam Triage Vital Signs ED Triage Vitals  Enc Vitals Group     BP 01/03/19 1243 (!) 140/117     Pulse Rate 01/03/19 1243 83     Resp 01/03/19  1243 18     Temp 01/03/19 1243 98.5 F (36.9 C)     Temp Source 01/03/19 1243 Oral     SpO2 01/03/19 1243 94 %     Weight --      Height --      Head Circumference --      Peak Flow --      Pain Score 01/03/19 1244 6     Pain Loc --      Pain Edu? --      Excl. in [place]? --    No data found.  Updated Vital Signs BP (!) 140/117 (BP Location: Right Arm)   Pulse 83   Temp 98.5 F (36.9 C) (Oral)   Resp 18   SpO2 94%    Physical Exam Constitutional:      General: She is not in acute distress.    Appearance: Normal appearance.  Eyes:     Extraocular Movements: Extraocular movements intact.     Conjunctiva/sclera: Conjunctivae normal.     Pupils: Pupils are equal, round, and reactive to light.  Neck:     Musculoskeletal: Normal range of motion and neck supple.  Cardiovascular:     Rate and Rhythm: Normal rate and regular rhythm.     Pulses: Normal pulses.     Heart sounds: Normal heart sounds.  Abdominal:     Palpations:  Abdomen is soft.     Tenderness: There is abdominal tenderness.     Comments: Suprapubic tenderness   Musculoskeletal:     Comments: Generalized pain to back with palpation  Skin:    General: Skin is warm and dry.  Neurological:     General: No focal deficit present.     Mental Status: She is alert and oriented to person, place, and time.     Cranial Nerves: Cranial nerves are intact.     Sensory: No sensory deficit.     Motor: No weakness.     Coordination: Coordination is intact.     Gait: Gait abnormal.     Comments: Unsteady gait that patient reports due to pain;   Psychiatric:        Mood and Affect: Mood normal.        Behavior: Behavior normal.        Thought Content: Thought content normal.        Judgment: Judgment normal.    Wart on bottom of left foot debrided with No. 15 blade  UC Treatments / Results  Labs (all labs ordered are listed, but only abnormal results are displayed) Labs Reviewed  POCT URINALYSIS DIP (DEVICE) -  Abnormal; Notable for the following components:      Result Value   Leukocytes,Ua SMALL (*)    All other components within normal limits  URINE CULTURE    EKG None  Radiology No results found.  Procedures Procedures (including critical care time)  Medications Ordered in UC Medications - No data to display  Initial Impression / Assessment and Plan / UC Course  I have reviewed the triage vital signs and the nursing notes.  Pertinent labs & imaging results that were available during my care of the patient were reviewed by me and considered in my medical decision making (see chart for details).    Final Clinical Impressions(s) / UC Diagnoses   Final diagnoses:  Chronic bilateral thoracic back pain     Discharge Instructions     One cimetidine or Tagamet daily for a month for the wart  Call [facility] to make appt with Midge Aver, MD    ED Prescriptions    Medication Sig Dispense Auth. Provider   cyclobenzaprine (FLEXERIL) 5 MG tablet Take 1 tablet (5 mg total) by mouth at bedtime. 7 tablet Robyn Haber, MD   predniSONE (DELTASONE) 20 MG tablet One daily with food 10 tablet Robyn Haber, MD   cephALEXin (KEFLEX) 500 MG capsule Take 1 capsule (500 mg total) by mouth 4 (four) times daily. 20 capsule Robyn Haber, MD     Controlled Substance Prescriptions [place] Controlled Substance Registry consulted? Not Applicable   Robyn Haber, MD 01/03/19 1324

## 2019-01-03 NOTE — Discharge Instructions (Addendum)
One cimetidine or Tagamet daily for a month for the wart  Call [facility] to make appt with Midge Aver, MD

## 2019-01-03 NOTE — ED Triage Notes (Signed)
Pt here for lower back pain into legs worse with movement

## 2019-01-04 LAB — URINE CULTURE

## 2019-06-27 ENCOUNTER — Other Ambulatory Visit: Payer: Self-pay

## 2019-06-27 ENCOUNTER — Ambulatory Visit
Admission: EM | Admit: 2019-06-27 | Discharge: 2019-06-27 | Disposition: A | Discharge status: Home / Self Care | Payer: Medicaid Other

## 2019-06-27 DIAGNOSIS — M545 Low back pain, unspecified: Secondary | ICD-10-CM

## 2019-06-27 DIAGNOSIS — M542 Cervicalgia: Secondary | ICD-10-CM

## 2019-06-27 MED ORDER — MELOXICAM 7.5 MG PO TABS: 7.5000 mg | ORAL_TABLET | Freq: Every day | ORAL | 0 refills | Status: DC

## 2019-06-27 MED ORDER — METHOCARBAMOL 500 MG PO TABS: 500.0000 mg | ORAL_TABLET | Freq: Two times a day (BID) | ORAL | 0 refills | Status: DC

## 2019-06-27 NOTE — ED Provider Notes (Signed)
[facility]    CSN: [id number] Arrival date & time: 06/27/19  1016     History   Chief Complaint Chief Complaint  Patient presents with   Back Pain    HPI Diana Gray is a 43 y.o. female.   43 year old female comes in for 2-week history of neck pain, back pain.  Denies injury/trauma.  However, her work requires heavy lifting, manual labor.  Pain is bilateral, right worse than the left.  Neck pain can radiate to the shoulders, but denies numbness, tingling, loss of grip strength.  Denies saddle anesthesia, loss of bladder or bowel control.  She has tried back brace, Tylenol with mild relief.  She has continued to work throughout the past 2 weeks, and states pain has been gradually worsening.  This morning, went to work, and was unable to do heavy lifting due to pain, and therefore came in for evaluation.     History reviewed. No pertinent past medical history.  There are no active problems to display for this patient.   Past Surgical History:  Procedure Laterality Date   BREAST SURGERY     ECTOPIC PREGNANCY SURGERY     PARTIAL HYSTERECTOMY      OB History   No obstetric history on file.      Home Medications    Prior to Admission medications   Medication Sig Start Date End Date Taking? Authorizing Provider  acetaminophen (TYLENOL) 500 MG tablet Take 500 mg by mouth every 6 (six) hours as needed.   Yes [provider]  meloxicam (MOBIC) 7.5 MG tablet Take 1 tablet (7.5 mg total) by mouth daily. 06/27/19   Tasia Catchings, Joeanna Howdyshell V, PA-C  methocarbamol (ROBAXIN) 500 MG tablet Take 1 tablet (500 mg total) by mouth 2 (two) times daily. 06/27/19   Ok Edwards, PA-C    Family History Family History  Problem Relation Age of Onset   Diabetes Mother    Diabetes Father    Heart failure Father     Social History Social History   Tobacco Use   Smoking status: Never Smoker   Smokeless tobacco: Never Used  Substance Use Topics   Alcohol use: Never   Frequency: Never   Drug use: Never     Allergies   Latex   Review of Systems Review of Systems  Reason unable to perform ROS: See HPI as above.     Physical Exam Triage Vital Signs ED Triage Vitals [06/27/19 1030]  Enc Vitals Group     BP 117/79     Pulse Rate 73     Resp 18     Temp 98 F (36.7 C)     Temp src      SpO2 97 %     Weight      Height      Head Circumference      Peak Flow      Pain Score 8     Pain Loc      Pain Edu?      Excl. in [place]?    No data found.  Updated Vital Signs BP 117/79    Pulse 73    Temp 98 F (36.7 C)    Resp 18    SpO2 97%   Visual Acuity Right Eye Distance:   Left Eye Distance:   Bilateral Distance:    Right Eye Near:   Left Eye Near:    Bilateral Near:     Physical Exam Constitutional:  General: She is not in acute distress.    Appearance: She is well-developed. She is not diaphoretic.  HENT:     Head: Normocephalic and atraumatic.  Eyes:     Conjunctiva/sclera: Conjunctivae normal.     Pupils: Pupils are equal, round, and reactive to light.  Neck:     Comments: No tenderness to palpation of spinous processes.  Tenderness to palpation of bilateral neck, max tenderness to the right paraspinal area.  Full range of motion of neck. Cardiovascular:     Rate and Rhythm: Normal rate and regular rhythm.     Heart sounds: Normal heart sounds. No murmur. No friction rub. No gallop.   Pulmonary:     Effort: Pulmonary effort is normal. No accessory muscle usage or respiratory distress.     Breath sounds: Normal breath sounds. No stridor. No decreased breath sounds, wheezing, rhonchi or rales.  Musculoskeletal:     Comments: No tenderness on palpation of the spinous processes.  Tenderness to palpation of bilateral shoulders/trapezius muscle.  Tenderness to palpation of bilateral lumbar region.  No tenderness to palpation of the hips.  Full range of motion of elbow, back, hip.  Right shoulder with decreased active range of  motion due to pain.  Full passive range of motion.  Strength normal and equal bilaterally.  Sensation intact and equal bilaterally.  Negative straight leg raise.    Radial pulse 2+, cap refill less than 2 seconds.    Skin:    General: Skin is warm and dry.  Neurological:     Mental Status: She is alert and oriented to person, place, and time.      UC Treatments / Results  Labs (all labs ordered are listed, but only abnormal results are displayed) Labs Reviewed - No data to display  EKG   Radiology No results found.  Procedures Procedures (including critical care time)  Medications Ordered in UC Medications - No data to display  Initial Impression / Assessment and Plan / UC Course  I have reviewed the triage vital signs and the nursing notes.  Pertinent labs & imaging results that were available during my care of the patient were reviewed by me and considered in my medical decision making (see chart for details).    Start NSAID as directed for pain and inflammation. Muscle relaxant as needed. Ice/heat compresses. Discussed with patient strain can take up to 3-4 weeks to resolve, but should be getting better each week. Return precautions given.   Final Clinical Impressions(s) / UC Diagnoses   Final diagnoses:  Neck pain  Acute bilateral low back pain without sciatica    ED Prescriptions    Medication Sig Dispense Auth. Provider   meloxicam (MOBIC) 7.5 MG tablet Take 1 tablet (7.5 mg total) by mouth daily. 15 tablet Griffin Gerrard V, PA-C   methocarbamol (ROBAXIN) 500 MG tablet Take 1 tablet (500 mg total) by mouth 2 (two) times daily. 20 tablet Tobin Chad, [place] 06/27/19 1146

## 2019-06-27 NOTE — Discharge Instructions (Signed)
Start Mobic. Do not take ibuprofen (motrin/advil)/ naproxen (aleve) while on mobic.Robaxin as needed, this can make you drowsy, so do not take if you are going to drive, operate heavy machinery, or make important decisions. Ice/heat compresses as needed. This can take up to 3-4 weeks to completely resolve, but you should be feeling better each week. Follow up with PCP if symptoms worsen, changes for reevaluation. If experience numbness/tingling of the inner thighs, loss of bladder or bowel control, go to the emergency department for evaluation.

## 2019-06-27 NOTE — ED Triage Notes (Signed)
Pt presents with complaints of neck pain radiating down her back x 2 weeks. Reports she works in [employer] and does a lot of lifting at work.

## 2019-07-29 ENCOUNTER — Ambulatory Visit
Admission: EM | Admit: 2019-07-29 | Discharge: 2019-07-29 | Disposition: A | Discharge status: Home / Self Care | Payer: Medicaid Other | Attending: Emergency Medicine | Admitting: Emergency Medicine

## 2019-07-29 DIAGNOSIS — Z20822 Contact with and (suspected) exposure to covid-19: Secondary | ICD-10-CM

## 2019-07-29 DIAGNOSIS — B9789 Other viral agents as the cause of diseases classified elsewhere: Secondary | ICD-10-CM

## 2019-07-29 DIAGNOSIS — M5441 Lumbago with sciatica, right side: Secondary | ICD-10-CM | POA: Diagnosis not present

## 2019-07-29 DIAGNOSIS — Z20828 Contact with and (suspected) exposure to other viral communicable diseases: Secondary | ICD-10-CM

## 2019-07-29 DIAGNOSIS — G8929 Other chronic pain: Secondary | ICD-10-CM | POA: Diagnosis not present

## 2019-07-29 DIAGNOSIS — J069 Acute upper respiratory infection, unspecified: Secondary | ICD-10-CM | POA: Diagnosis not present

## 2019-07-29 MED ORDER — PREDNISONE 20 MG PO TABS: 20.0000 mg | ORAL_TABLET | Freq: Every day | ORAL | 0 refills | Status: AC

## 2019-07-29 MED ORDER — BENZONATATE 100 MG PO CAPS: 100.0000 mg | ORAL_CAPSULE | Freq: Three times a day (TID) | ORAL | 0 refills | Status: DC

## 2019-07-29 NOTE — ED Triage Notes (Signed)
Pt states loss taste and smell x2wks ago

## 2019-07-29 NOTE — ED Triage Notes (Signed)
Pt c/o lower back pain radiating down rt leg x2wks. Pt c/o productive cough with brown phlegm.

## 2019-07-29 NOTE — Discharge Instructions (Addendum)
Your COVID test is pending: Is important you quarantine at home until your results are back. °You may take OTC Tylenol for fever and myalgias.  It is important to drink plenty of water throughout the day to stay hydrated. °If you test positive and later develop severe fever, cough, or shortness of breath, it is recommended that you go to the ER for further evaluation. °

## 2019-07-29 NOTE — ED Notes (Signed)
Pt states she gets COVID testing every week at work.

## 2019-07-29 NOTE — ED Provider Notes (Signed)
[facility]    CSN: [id number] Arrival date & time: 07/29/19  0947      History   Chief Complaint Chief Complaint  Patient presents with  . Back Pain    HPI Diana Gray is a 43 y.o. female with history of chronic low back pain with sciatica presents for multiple concerns.  Patient endorsing lower back pain radiating down her right leg intermittently for the last 2 weeks.  States this is worse with certain positions, lifting.  Patient has seen providers in urgent care numerous times, by her primary care as well.  Patient feels this time to see a specialist.  Reports improvement with prednisone as ibuprofen, Loxitane, Robaxin has not been helpful.  Denies saddle area anesthesia, urinary fecal incontinence, change to bowel or bladder habit, lower extremity numbness. Patient also noting 3-day course of productive, non-hemoptic cough, nasal congestion, rhinorrhea, loss of smell/taste.  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  States they have had COVID positive patients: She has been getting tested via work once weekly.  Patient requesting test today as she feels she should stay home to keep the residence safe.  Denies fever, chest pain, shortness of breath.   History reviewed. No pertinent past medical history.  There are no active problems to display for this patient.   Past Surgical History:  Procedure Laterality Date  . BREAST SURGERY    . ECTOPIC PREGNANCY SURGERY    . PARTIAL HYSTERECTOMY      OB History   No obstetric history on file.      Home Medications    Prior to Admission medications   Medication Sig Start Date End Date Taking? Authorizing Provider  acetaminophen (TYLENOL) 500 MG tablet Take 500 mg by mouth every 6 (six) hours as needed.    [provider]  benzonatate (TESSALON) 100 MG capsule Take 1 capsule (100 mg total) by mouth every 8 (eight) hours. 07/29/19   Hall-Potvin, [place], PA-C  meloxicam (MOBIC) 7.5  MG tablet Take 1 tablet (7.5 mg total) by mouth daily. 06/27/19   Tasia Catchings, Amy V, PA-C  methocarbamol (ROBAXIN) 500 MG tablet Take 1 tablet (500 mg total) by mouth 2 (two) times daily. 06/27/19   Tasia Catchings, Amy V, PA-C  predniSONE (DELTASONE) 20 MG tablet Take 1 tablet (20 mg total) by mouth daily for 7 days. 07/29/19 08/05/19  Hall-Potvin, [place], PA-C    Family History Family History  Problem Relation Age of Onset  . Diabetes Mother   . Diabetes Father   . Heart failure Father     Social History Social History   Tobacco Use  . Smoking status: Never Smoker  . Smokeless tobacco: Never Used  Substance Use Topics  . Alcohol use: Never    Frequency: Never  . Drug use: Never     Allergies   Latex   Review of Systems Review of Systems  Constitutional: Negative for fatigue and fever.  HENT: Positive for congestion and rhinorrhea. Negative for dental problem, ear pain, facial swelling, hearing loss, sinus pain, sore throat, trouble swallowing and voice change.   Eyes: Negative for photophobia, pain, redness and visual disturbance.  Respiratory: Positive for cough. Negative for choking, shortness of breath, wheezing and stridor.   Cardiovascular: Negative for chest pain, palpitations and leg swelling.  Gastrointestinal: Negative for abdominal pain, diarrhea and vomiting.  Musculoskeletal: Positive for back pain. Negative for arthralgias, myalgias and neck pain.  Skin: Negative for rash and wound.  Neurological:  Negative for dizziness, syncope and headaches.     Physical Exam Triage Vital Signs ED Triage Vitals [07/29/19 1001]  Enc Vitals Group     BP 110/65     Pulse Rate 82     Resp 18     Temp 98.2 F (36.8 C)     Temp Source Oral     SpO2 98 %     Weight      Height      Head Circumference      Peak Flow      Pain Score 10     Pain Loc      Pain Edu?      Excl. in [place]?    No data found.  Updated Vital Signs BP 110/65 (BP Location: Left Arm)   Pulse 82   Temp 98.2 F  (36.8 C) (Oral)   Resp 18   SpO2 98%    Physical Exam Constitutional:      General: She is not in acute distress.    Appearance: She is obese. She is not toxic-appearing.  HENT:     Head: Normocephalic and atraumatic.     Jaw: There is normal jaw occlusion. No tenderness or pain on movement.     Right Ear: Hearing, tympanic membrane, ear canal and external ear normal. No tenderness. No mastoid tenderness.     Left Ear: Hearing, tympanic membrane, ear canal and external ear normal. No tenderness. No mastoid tenderness.     Nose: Rhinorrhea present. No nasal deformity, septal deviation or nasal tenderness.     Right Turbinates: Not swollen or pale.     Left Turbinates: Not swollen or pale.     Right Sinus: No maxillary sinus tenderness or frontal sinus tenderness.     Left Sinus: No maxillary sinus tenderness or frontal sinus tenderness.     Comments: Mild bilateral turbinate edema with mucosal injection    Mouth/Throat:     Lips: Pink. No lesions.     Mouth: Mucous membranes are moist. No injury.     Pharynx: Oropharynx is clear. Uvula midline. No posterior oropharyngeal erythema or uvula swelling.     Comments: no tonsillar exudate or hypertrophy Eyes:     General: No scleral icterus.    Conjunctiva/sclera: Conjunctivae normal.     Pupils: Pupils are equal, round, and reactive to light.  Neck:     Musculoskeletal: Normal range of motion and neck supple. No muscular tenderness.  Cardiovascular:     Rate and Rhythm: Normal rate and regular rhythm.  Pulmonary:     Effort: Pulmonary effort is normal. No respiratory distress.     Breath sounds: No wheezing or rales.     Comments: Good air entry bilaterally Musculoskeletal:     Comments: This bony deformity of lumbar spine.  No spinous process tenderness.  Tender over right paraspinal.  No PSIS tenderness bilaterally.  Gait mildly antalgic, favoring right.  NVI  Lymphadenopathy:     Cervical: No cervical adenopathy.  Skin:     Capillary Refill: Capillary refill takes less than 2 seconds.     Coloration: Skin is not jaundiced or pale.  Neurological:     General: No focal deficit present.     Mental Status: She is alert and oriented to person, place, and time.      UC Treatments / Results  Labs (all labs ordered are listed, but only abnormal results are displayed) Labs Reviewed  NOVEL CORONAVIRUS, NAA    EKG  Radiology No results found.  Procedures Procedures (including critical care time)  Medications Ordered in UC Medications - No data to display  Initial Impression / Assessment and Plan / UC Course  I have reviewed the triage vital signs and the nursing notes.  Pertinent labs & imaging results that were available during my care of the patient were reviewed by me and considered in my medical decision making (see chart for details).     1.  Chronic bilateral low back pain with right-sided sciatica No trauma, injury to the area: Radiography deferred.  History and physical consistent with sciatica: We will treat with prednisone, have patient follow-up with specialists.  2.  Viral URI with cough No shortness of breath/respiratory distress today.  Radiography deferred due to normal vitals, reassuring cardiopulmonary exam, short duration of cough.  Treating low back pain with prednisone, which may help with cough.  Will add benzonatate as adjuvant therapy.  Covid test pending.  Return precautions discussed, patient verbalized understanding and is agreeable to plan. Final Clinical Impressions(s) / UC Diagnoses   Final diagnoses:  Viral URI with cough  Suspected Covid-19 Virus Infection  Chronic bilateral low back pain with right-sided sciatica     Discharge Instructions     Your COVID test is pending: Is important you quarantine at home until your results are back. You may take OTC Tylenol for fever and myalgias.  It is important to drink plenty of water throughout the day to stay hydrated.  If you test positive and later develop severe fever, cough, or shortness of breath, it is recommended that you go to the ER for further evaluation.    ED Prescriptions    Medication Sig Dispense Auth. Provider   predniSONE (DELTASONE) 20 MG tablet Take 1 tablet (20 mg total) by mouth daily for 7 days. 7 tablet Hall-Potvin, [place], PA-C   benzonatate (TESSALON) 100 MG capsule Take 1 capsule (100 mg total) by mouth every 8 (eight) hours. 21 capsule Hall-Potvin, [place], PA-C     Controlled Substance Prescriptions [place] Controlled Substance Registry consulted? Not Applicable   Quincy Sheehan, [place] 07/29/19 1219

## 2019-07-31 ENCOUNTER — Encounter (HOSPITAL_COMMUNITY): Payer: Self-pay

## 2019-07-31 LAB — NOVEL CORONAVIRUS, NAA: SARS-CoV-2, NAA: NOT DETECTED

## 2019-08-26 ENCOUNTER — Other Ambulatory Visit: Payer: Self-pay | Admitting: Orthopedic Surgery

## 2019-08-26 DIAGNOSIS — M545 Low back pain, unspecified: Secondary | ICD-10-CM

## 2019-09-07 ENCOUNTER — Other Ambulatory Visit: Payer: Self-pay

## 2019-09-07 ENCOUNTER — Ambulatory Visit
Admission: RE | Admit: 2019-09-07 | Discharge: 2019-09-07 | Disposition: A | Discharge status: Home / Self Care | Payer: Medicaid Other | Source: Ambulatory Visit | Attending: Orthopedic Surgery | Admitting: Orthopedic Surgery

## 2019-09-07 DIAGNOSIS — M545 Low back pain, unspecified: Secondary | ICD-10-CM

## 2019-09-07 IMAGING — MR MR LUMBAR SPINE W/O CM
4 of 5 series · 26 of 48 positions shown · non-contrast
Comparison: None.

CLINICAL DATA: Low back pain worsening over the last several
months.

EXAM:
MRI LUMBAR SPINE WITHOUT CONTRAST
TECHNIQUE: Multiplanar, multisequence MR imaging of the lumbar spine was
performed. No intravenous contrast was administered.

[Series 2: T2 · sagittal · 4.0mm · 0.55mm/px · 6 of 16 slices shown (1 of 2)]
[im 1/16]
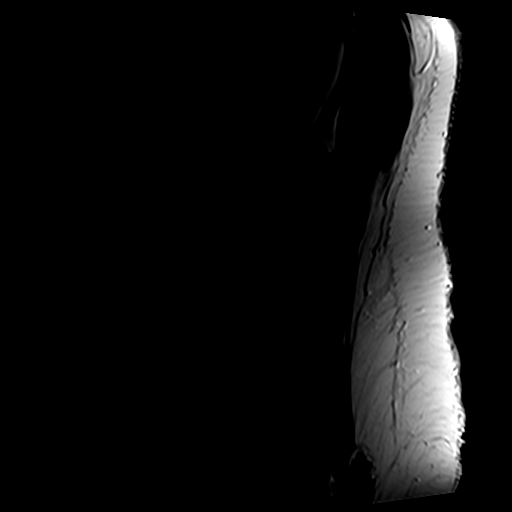
[im 4/16]
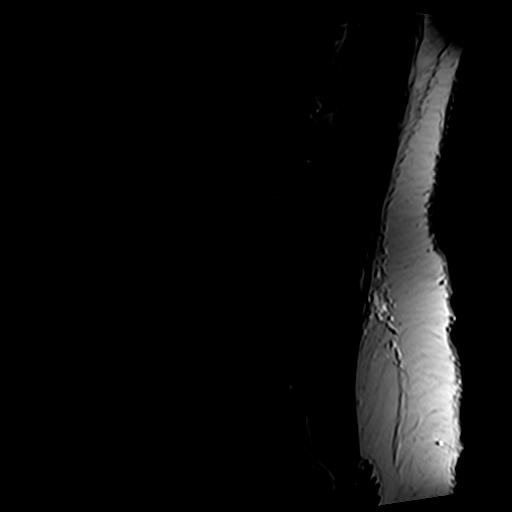
[im 7/16]
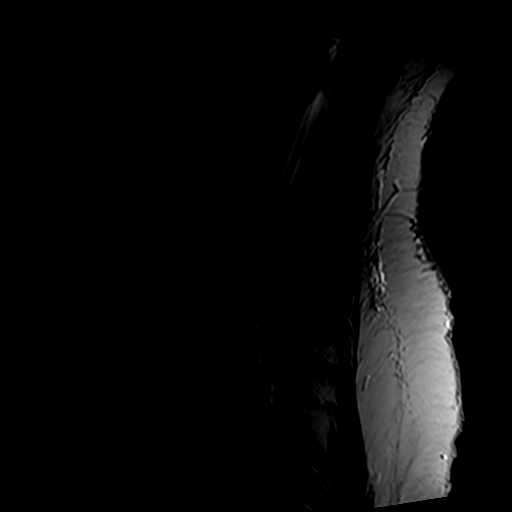
[im 10/16]
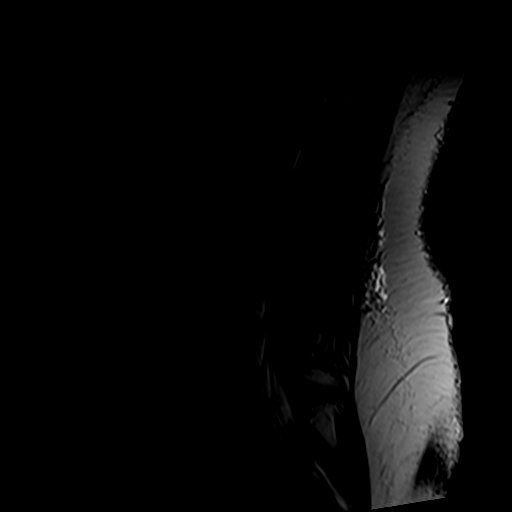
[im 13/16]
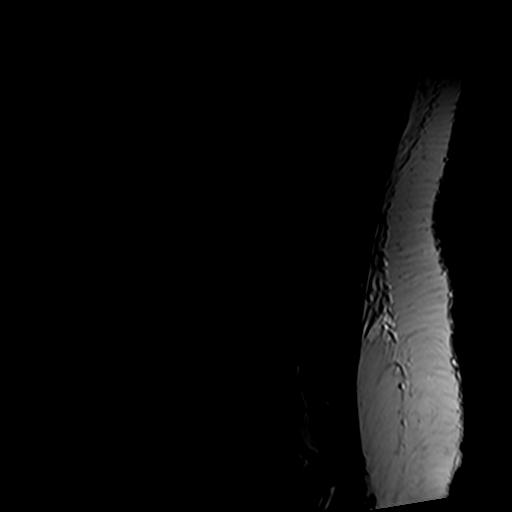
[im 16/16]
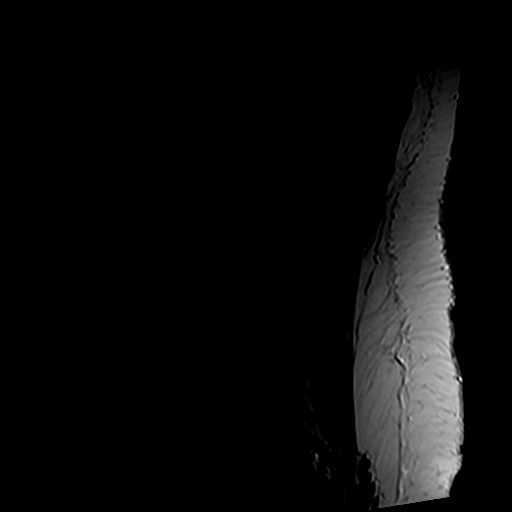

[Series 4: T1 · sagittal · 4.0mm · 0.55mm/px · 6 of 16 slices shown (1 of 2)]
[im 1/16]
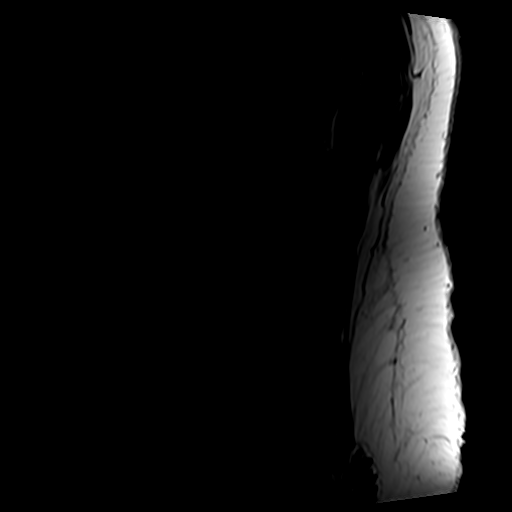
[im 4/16]
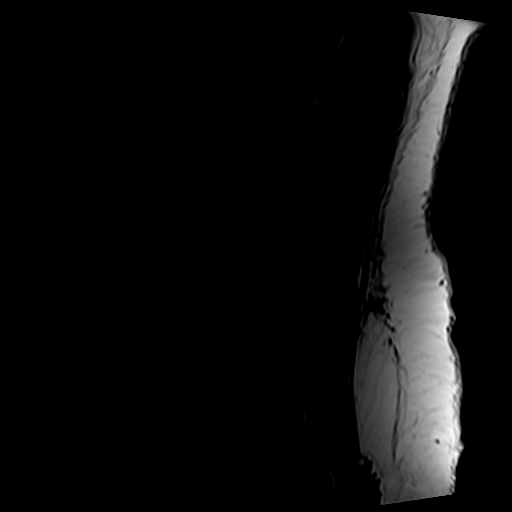
[im 7/16]
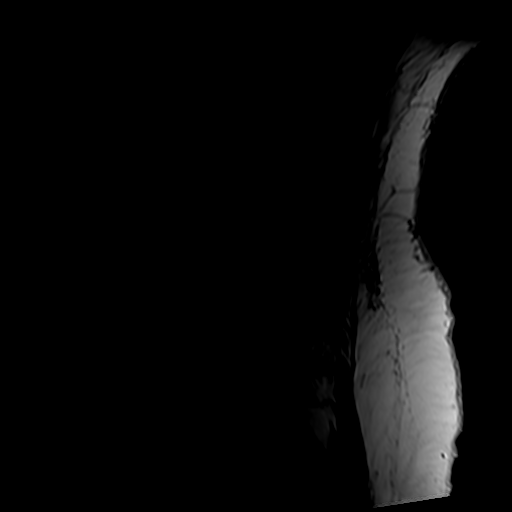
[im 10/16]
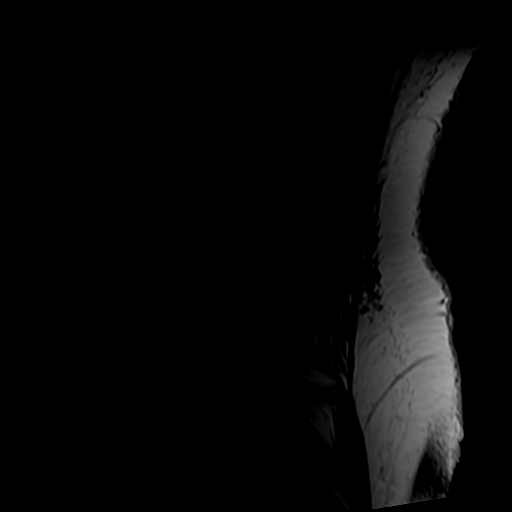
[im 13/16]
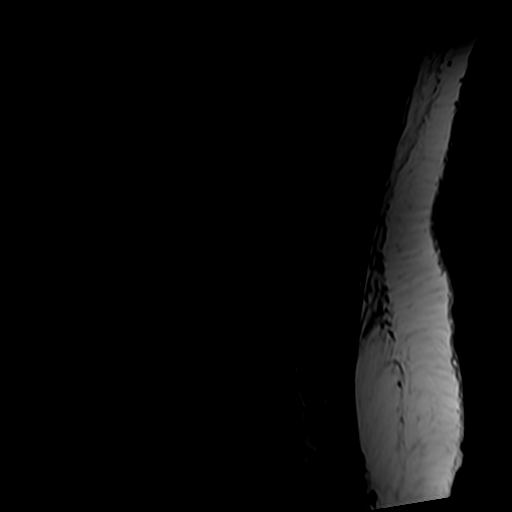
[im 16/16]
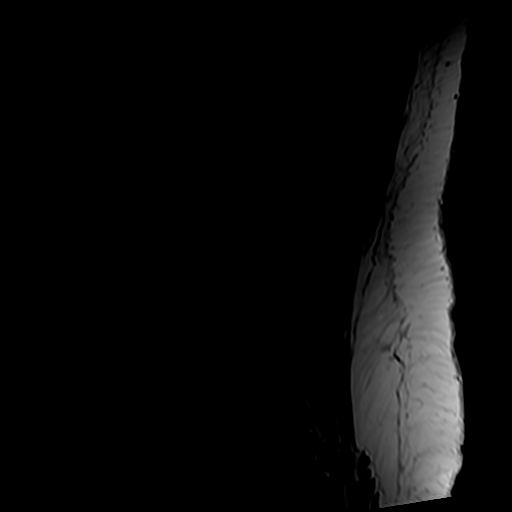

[Series 5: T2 · axial · 4.0mm · 0.70mm/px · z∈[-112,+99]mm · 9 of 37 slices shown (2 of 2)]
[im 1/37]
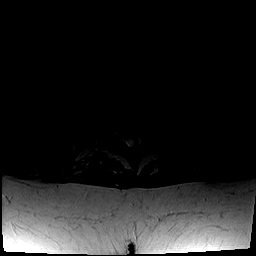
[im 6/37]
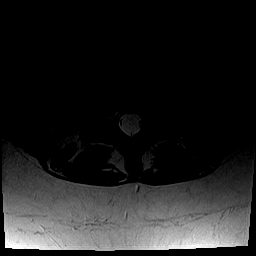
[im 11/37]
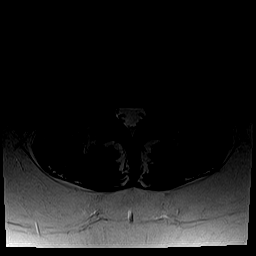
[im 16/37]
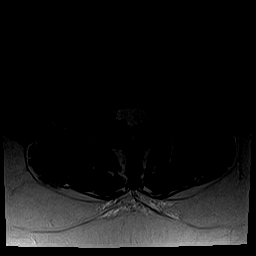
[im 19/37]
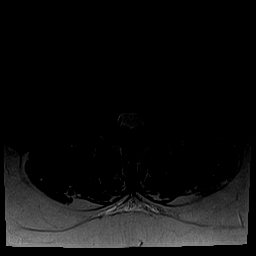
[im 21/37]
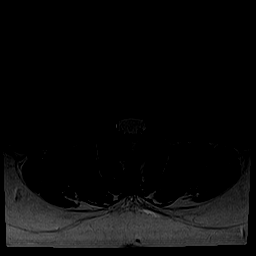
[im 26/37]
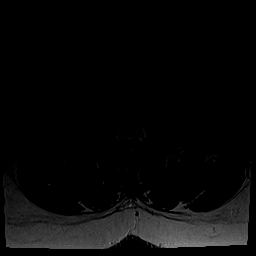
[im 31/37]
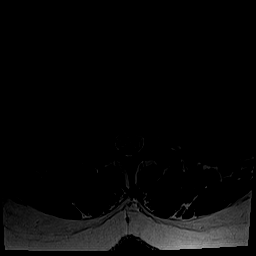
[im 37/37]
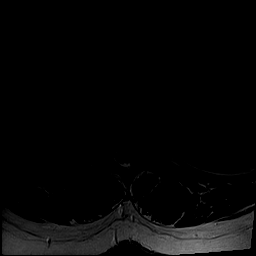

[Series 6: T1 · axial · 4.0mm · 0.35mm/px · z∈[-112,+68]mm · 5 of 37 slices shown (2 of 2)]
[im 1/37]
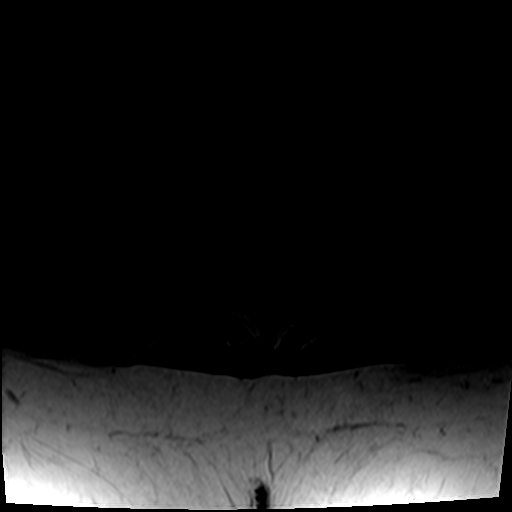
[im 6/37]
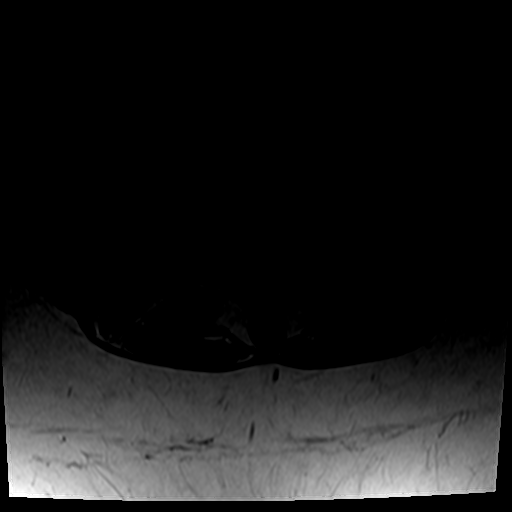
[im 11/37]
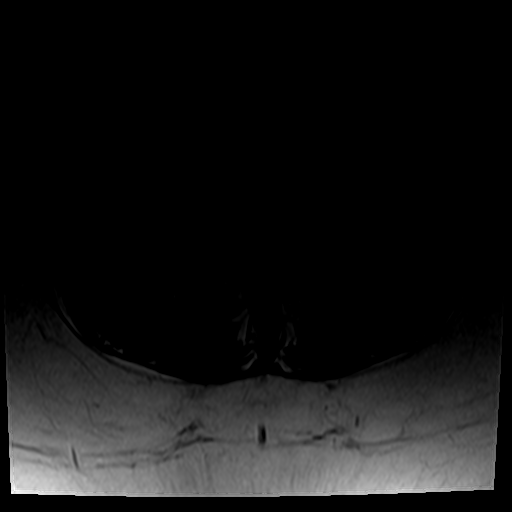
[im 19/37]
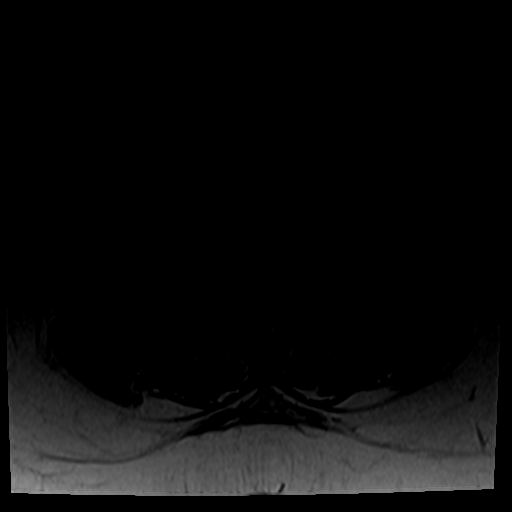
[im 31/37]
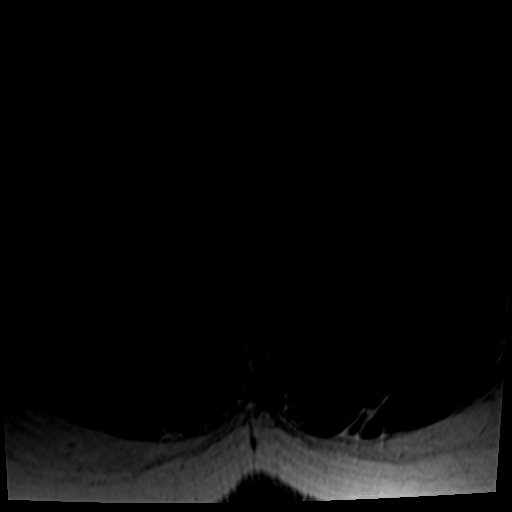

[26 of 48 positions shown; findings below may reference images not displayed]

FINDINGS: Segmentation:  5 lumbar type vertebral bodies.

Alignment:  Normal

Vertebrae:  Normal

Conus medullaris and cauda equina: Conus extends to the L1-2 level.
Conus and cauda equina appear normal.

Paraspinal and other soft tissues: Normal

Disc levels:

All disc levels appear normal. No disc degeneration, bulge or
herniation. No facet arthropathy.
IMPRESSION: Normal MRI of the lumbar spine. No abnormality seen to explain pain.

## 2019-09-08 ENCOUNTER — Ambulatory Visit: Payer: Medicaid Other | Attending: Orthopedic Surgery | Admitting: Physical Therapy

## 2019-09-08 ENCOUNTER — Other Ambulatory Visit: Payer: Self-pay

## 2019-09-08 ENCOUNTER — Encounter: Payer: Self-pay | Admitting: Physical Therapy

## 2019-09-08 DIAGNOSIS — M5441 Lumbago with sciatica, right side: Secondary | ICD-10-CM | POA: Insufficient documentation

## 2019-09-08 DIAGNOSIS — G8929 Other chronic pain: Secondary | ICD-10-CM | POA: Insufficient documentation

## 2019-09-08 NOTE — Therapy (Signed)
[place], [place], [address] Phone: [phone]   Fax:  [phone]  Physical Therapy Evaluation  Patient Details  Name: Diana Gray MRN: [id number] Date of Birth: 09-15-1976 Referring Provider (PT): Marchia Bond, MD   Encounter Date: 09/08/2019  PT End of Session - 09/08/19 0924    Visit Number  1    Number of Visits  4    Date for PT Re-Evaluation  10/13/19    Authorization Type  MCD    PT Start Time  0835    PT Stop Time  0915    PT Time Calculation (min)  40 min    Activity Tolerance  Patient tolerated treatment well    Behavior During Therapy  [facility] for tasks assessed/performed       History reviewed. No pertinent past medical history.  Past Surgical History:  Procedure Laterality Date  . BREAST SURGERY    . ECTOPIC PREGNANCY SURGERY    . PARTIAL HYSTERECTOMY      There were no vitals filed for this visit.   Subjective Assessment - 09/08/19 0837    Subjective  She relays that she started having back pain 9 years ago and had breast reduction surgery. 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 Her MD just took her out of work for the last month.    Limitations  Lifting;Sitting;Walking;House hold activities    How long can you sit comfortably?  5-10 min    How long can you stand comfortably?  5-10 min    How long can you walk comfortably?  10-15 min    Diagnostic tests  "IMPRESSION:Normal MRI of the lumbar spine. No abnormality seen to explain pain."    Patient Stated Goals  Get the pain tolerable    Currently in Pain?  Yes    Pain Score  --   6-10   Pain Location  Back    Pain Orientation  Mid;Lower;Right    Pain Descriptors / Indicators  Shooting    Pain Type  Chronic pain    Pain Radiating Towards  referred pain down her Rt leg but denies numbness, tingling in her leg, does fell it will give out on her.    Pain Onset  More than  a month ago    Pain Frequency  Constant    Aggravating Factors   prolonged positions or standing, sitting, lying, she cant lay on her Rt side, bending over    Pain Relieving Factors  lay on stomach, takes meds         [facility] PT Assessment - 09/08/19 0001      Assessment   Medical Diagnosis  chronic LBP with Rt radiculopathy    Referring Provider (PT)  Marchia Bond, MD    Onset Date/Surgical Date  --   chronic pain for 9 years   Next MD Visit  09/10/19    Prior Therapy  once      Restrictions   Other Position/Activity Restrictions  out of work for at least a month, no heavy lifting or repeated bending, she was prescribed back brace but reports it did not help      Balance Screen   Has the patient fallen in the past 6 months  No      [place] residence      Prior Function   Level of Independence  Independent  Vocation  Full time employment    Radio producer    Leisure  watches her son play football      Cognition   Overall Cognitive Status  Within Functional Limits for tasks assessed      Observation/Other Assessments   Focus on Therapeutic Outcomes (FOTO)   not done MCD      Sensation   Light Touch  Appears Intact      Coordination   Gross Motor Movements are Fluid and Coordinated  Yes      Posture/Postural Control   Posture Comments  Lateral shift to her Lt off of her Rt side, anterior pelvic tilt      ROM / Strength   AROM / PROM / Strength  AROM;Strength      AROM   AROM Assessment Site  Lumbar    Lumbar Flexion  <25%    Lumbar Extension  50%    Lumbar - Right Side Bend  50%    Lumbar - Left Side Bend  WFL    Lumbar - Right Rotation  50%    Lumbar - Left Rotation  50%      Strength   Overall Strength Comments  Rt leg overall 4/5 MMT but limited my pain, Lt leg WNL      Flexibility   Soft Tissue Assessment /Muscle Length  --   tight glutes, piriformis, lumbar P.S Rt>Lt      Palpation   Spinal mobility  WNL    Palpation comment  TTP lower lumbar/sacral segments, Rt SI jt, Rt glutes and paraspinals      Special Tests   Other special tests  + slump test on Rt, + SLR test on Rt      Transfers   Transfers  Independent with all Transfers    Comments  increased time needed due to pain      Ambulation/Gait   Gait Comments  antalgic gait on Rt                Objective measurements completed on examination: See above findings.      [facility] Adult PT Treatment/Exercise - 09/08/19 0001      Modalities   Modalities  Electrical Stimulation;Moist Heat      Moist Heat Therapy   Number Minutes Moist Heat  10 Minutes    Moist Heat Location  Lumbar Spine      Electrical Stimulation   Electrical Stimulation Location  lumbar    Electrical Stimulation Action  IFC    Electrical Stimulation Parameters  tolerance in prone    Electrical Stimulation Goals  Pain             PT Education - 09/08/19 0923    Education Details  HEP, POC, TENS, pain science    Person(s) Educated  Patient    Methods  Explanation;Demonstration;Handout;Verbal cues    Comprehension  Verbalized understanding;Need further instruction          PT Long Term Goals - 09/08/19 0933      PT LONG TERM GOAL #1   Title  Pt will be I and compliant with HEP. (target for all goals 4 weeks 10/13/19)    Baseline  no HEP until today    Status  New      PT LONG TERM GOAL #2   Title  Pt will report pain reduction by overall 50%    Baseline  6-10 on avg    Status  New  PT LONG TERM GOAL #3   Title  Pt will be able to stand at least an hour for activity and work, and ambulate community distances without antalgic gait.    Status  New      PT LONG TERM GOAL #4   Title  She will improve lumbar ROM to [facility] (at least 75%)    Baseline  25-50% lumbar ROM    Status  New             Plan - 09/08/19 [id number]    Clinical Impression Statement  Pt presents with chronic LBP with Rt leg  referred pain and weakness. She had recent lumbar MRI which was negative so pain is likely for muscular strain in nature. She has overall trunk shift to her Lt, anterior pelvic tilt, decreased lumbar ROM, decreased activity tolerance and increased pain. She will benefit from skilled PT to address her deficits.    Personal Factors and Comorbidities  Time since onset of injury/illness/exacerbation    Examination-Activity Limitations  Locomotion Level;Sleep;Squat;Lift;Carry;Caring for Others;Bend    Examination-Participation Restrictions  Meal Prep;Cleaning;Community Activity;Driving;Laundry;Shop    Stability/Clinical Decision Making  Evolving/Moderate complexity    Clinical Decision Making  Moderate    Rehab Potential  Good    PT Frequency  1x / week    PT Duration  4 weeks    PT Treatment/Interventions  ADLs/Self Care Home Management;Aquatic Therapy;Cryotherapy;Electrical Stimulation;Moist Heat;Traction;Ultrasound;Therapeutic activities;Therapeutic exercise;Neuromuscular re-education;Manual techniques;Passive range of motion;Dry needling;Joint Manipulations;Spinal Manipulations;Taping    PT Next Visit Plan  review HEP and update PRN, needs pain science education, lumbar/Rt hip stretching and strengthening    PT Home Exercise Plan  trunk shift correction, posterior pelvic tilts, piriformis shift, standing lumbar L stretch at counter, slump stretch    Consulted and Agree with Plan of Care  Patient       Patient will benefit from skilled therapeutic intervention in order to improve the following deficits and impairments:  Decreased endurance, Decreased range of motion, Decreased strength, Difficulty walking, Abnormal gait, Increased muscle spasms, Postural dysfunction, Pain  Visit Diagnosis: Chronic bilateral low back pain with right-sided sciatica     Problem List There are no active problems to display for this patient.   Silvestre Mesi 09/08/2019, 9:37 AM  [place], [place], [address] Phone: [phone]   Fax:  [phone]  Name: Diana Gray MRN: [id number] Date of Birth: [birth year]

## 2019-09-08 NOTE — Patient Instructions (Addendum)
Access Code: [email or web address]  URL: [email or web address]  Date: 09/08/2019  Prepared by: Elsie Ra   Exercises  Right Standing Lateral Shift Correction at [place] - 10 reps - 1-3 sets - 2x daily - 6x weekly  Supine Posterior Pelvic Tilt - 10 reps - 2 sets - 5 hold - 2x daily - 6x weekly  Seated Piriformis Stretch - 3 reps - 1 sets - 30 hold - 2x daily - 6x weekly  Slump Stretch - 10 reps - 1-2 sets - 3 hold - 2x daily - 6x weekly  Standing 'L' Stretch at Counter - 10 reps - 1 sets - 5 hold - 2x daily - 6x weekly   TENS UNIT: This is helpful for muscle pain and spasm.   Search and Purchase a TENS 7000 2nd edition at www.tenspros.com. It should be less than $30.     TENS unit instructions: Do not shower or bathe with the unit on Turn the unit off before removing electrodes or batteries If the electrodes lose stickiness add a drop of water to the electrodes after they are disconnected from the unit and place on plastic sheet. If you continued to have difficulty, call the TENS unit company to purchase more electrodes. Do not apply lotion on the skin area prior to use. Make sure the skin is clean and dry as this will help prolong the life of the electrodes. After use, always check skin for unusual red areas, rash or other skin difficulties. If there are any skin problems, does not apply electrodes to the same area. Never remove the electrodes from the unit by pulling the wires. Do not use the TENS unit or electrodes other than as directed. Do not change electrode placement without consultating your therapist or physician. Keep 2 fingers with between each electrode. Wear time ratio is 2:1, on to off times.    For example on for 30 minutes off for 15 minutes and then on for 30 minutes off for 15 minutes

## 2019-09-15 ENCOUNTER — Ambulatory Visit: Payer: Medicaid Other | Admitting: Physical Therapy

## 2019-09-22 ENCOUNTER — Ambulatory Visit: Payer: Medicaid Other | Attending: Orthopedic Surgery | Admitting: Physical Therapy

## 2019-09-29 ENCOUNTER — Ambulatory Visit: Payer: Medicaid Other | Admitting: Physical Therapy

## 2020-01-05 ENCOUNTER — Other Ambulatory Visit: Payer: Self-pay

## 2020-01-05 ENCOUNTER — Ambulatory Visit (INDEPENDENT_AMBULATORY_CARE_PROVIDER_SITE_OTHER): Payer: Medicaid Other

## 2020-01-05 ENCOUNTER — Ambulatory Visit: Payer: Medicaid Other | Admitting: Podiatry

## 2020-01-05 ENCOUNTER — Encounter: Payer: Self-pay | Admitting: Podiatry

## 2020-01-05 DIAGNOSIS — D492 Neoplasm of unspecified behavior of bone, soft tissue, and skin: Secondary | ICD-10-CM

## 2020-01-05 DIAGNOSIS — B070 Plantar wart: Secondary | ICD-10-CM

## 2020-01-07 NOTE — Progress Notes (Signed)
   Subjective: 44 y.o. female presenting to the office today as a new patient, referred by Dr. Gershon Mussel, with a chief complaint of sharp, throbbing pain noted to the right plantar forefoot that has been gradually worsening over the past 8 years. Walking increases the pain. She has had calluses shaved from the area and has gotten injections for treatment. Patient is here for further evaluation and treatment.   No past medical history on file.   Objective:  Physical Exam General: Alert and oriented x3 in no acute distress  Dermatology: Hyperkeratotic lesion(s) present on the right foot. Pain on palpation with a central nucleated core noted. Skin is warm, dry and supple bilateral lower extremities. Negative for open lesions or macerations.  Vascular: Palpable pedal pulses bilaterally. No edema or erythema noted. Capillary refill within normal limits.  Neurological: Epicritic and protective threshold grossly intact bilaterally.   Musculoskeletal Exam: Pain on palpation at the keratotic lesion(s) noted. Range of motion within normal limits bilateral. Muscle strength 5/5 in all groups bilateral.  Assessment: 1. Porokeratosis right x 2    Plan of Care:  1. Patient evaluated 2. Excisional debridement of keratoic lesion(s) using a chisel blade was performed without incident. Cantharone applied.  3. Dressed area with light dressing. 4. Patient is to return to the clinic in 2 weeks.  48 year old son plays football at [employer].    Edrick Kins, DPM [facility]  Dr. Edrick Kins, [address]                                        [place], [place] [address]                Office [phone]  Fax [phone]

## 2020-01-26 ENCOUNTER — Other Ambulatory Visit: Payer: Self-pay

## 2020-01-26 ENCOUNTER — Ambulatory Visit: Payer: Medicaid Other | Admitting: Podiatry

## 2020-01-26 DIAGNOSIS — L989 Disorder of the skin and subcutaneous tissue, unspecified: Secondary | ICD-10-CM

## 2020-01-26 DIAGNOSIS — B070 Plantar wart: Secondary | ICD-10-CM

## 2020-01-26 DIAGNOSIS — D492 Neoplasm of unspecified behavior of bone, soft tissue, and skin: Secondary | ICD-10-CM | POA: Diagnosis not present

## 2020-01-29 NOTE — Progress Notes (Signed)
   Subjective: 44 y.o. female presenting to the office today for follow up evaluation of porokeratosis of the right foot. She states she is doing well and has improved greatly. She denies any pain or modifying factors. She has not done anything at home for treatment. Patient is here for further evaluation and treatment.   No past medical history on file.   Objective:  Physical Exam General: Alert and oriented x3 in no acute distress  Dermatology: Hyperkeratotic lesion(s) present on the right foot. Pain on palpation with a central nucleated core noted. Skin is warm, dry and supple bilateral lower extremities. Negative for open lesions or macerations.  Vascular: Palpable pedal pulses bilaterally. No edema or erythema noted. Capillary refill within normal limits.  Neurological: Epicritic and protective threshold grossly intact bilaterally.   Musculoskeletal Exam: Pain on palpation at the keratotic lesion(s) noted. Range of motion within normal limits bilateral. Muscle strength 5/5 in all groups bilateral.  Assessment: 1. Porokeratosis right x 2    Plan of Care:  1. Patient evaluated 2. Excisional debridement of keratoic lesion(s) using a chisel blade was performed without incident.  3. Dressed area with light dressing. 4. Recommended OTC corn and callus remover.  5. Patient is to return to the clinic as needed.  2 year old son plays football at [employer].    Edrick Kins, DPM [facility]  Dr. Edrick Kins, [address]                                        [place], [place] [address]                Office [phone]  Fax [phone]

## 2021-01-19 ENCOUNTER — Other Ambulatory Visit: Payer: Self-pay | Admitting: Orthopedic Surgery

## 2021-01-19 DIAGNOSIS — M25561 Pain in right knee: Secondary | ICD-10-CM

## 2021-02-04 ENCOUNTER — Other Ambulatory Visit: Payer: Medicaid Other

## 2021-02-22 ENCOUNTER — Other Ambulatory Visit: Payer: Self-pay

## 2021-02-22 ENCOUNTER — Ambulatory Visit
Admission: RE | Admit: 2021-02-22 | Discharge: 2021-02-22 | Disposition: A | Discharge status: Home / Self Care | Payer: Medicaid Other | Source: Ambulatory Visit | Attending: Orthopedic Surgery | Admitting: Orthopedic Surgery

## 2021-02-22 DIAGNOSIS — M25561 Pain in right knee: Secondary | ICD-10-CM

## 2021-02-22 IMAGING — MR MR KNEE*R* W/O CM
6 series · 40 of 40 positions shown · non-contrast
Comparison: Radiographs [DATE]

CLINICAL DATA: Right knee pain, swelling and popping for 6 months.
No known injury or prior relevant surgery.

EXAM:
MRI OF THE RIGHT KNEE WITHOUT CONTRAST
TECHNIQUE: Multiplanar, multisequence MR imaging of the knee was performed. No
intravenous contrast was administered.

[Series 6: T2 fat-sat · axial · right · 4.0mm · 0.50mm/px · z∈[-63,+89]mm · 9 of 36 slices shown (1 of 3)]
[im 1/36]
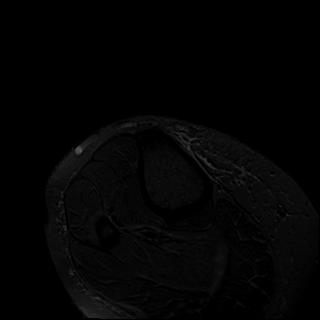
[im 5/36]
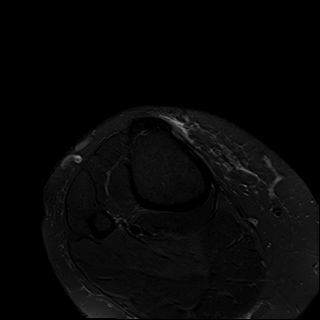
[im 9/36]
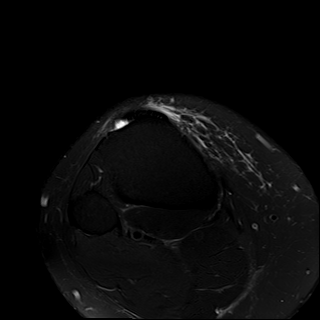
[im 14/36]
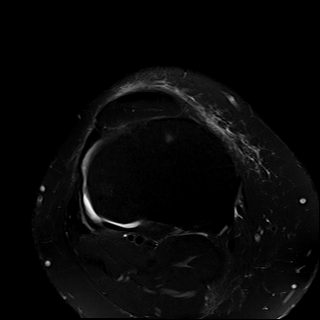
[im 18/36]
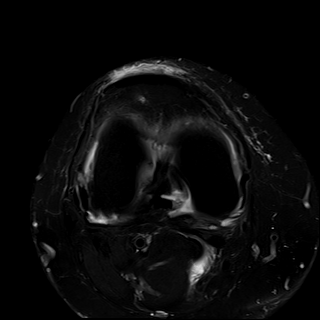
[im 22/36]
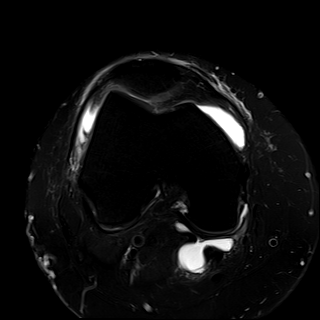
[im 27/36]
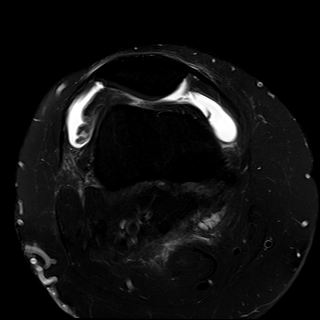
[im 31/36]
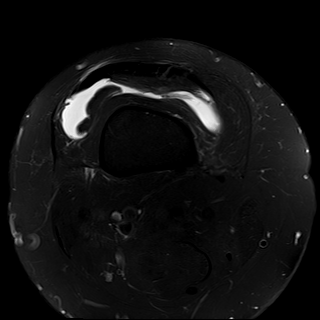
[im 36/36]
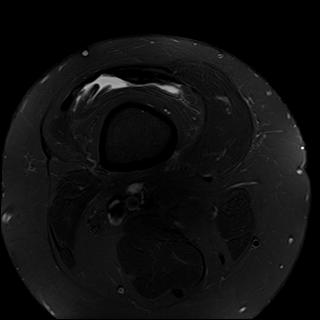

[Series 7: T2 fat-sat · coronal · right · 4.0mm · 0.47mm/px · 6 of 28 slices shown (2 of 3)]
[im 1/28]
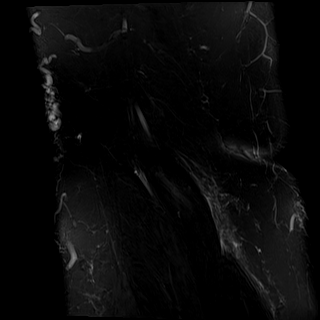
[im 6/28]
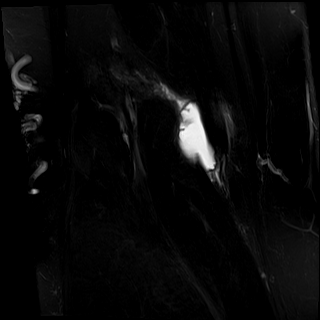
[im 11/28]
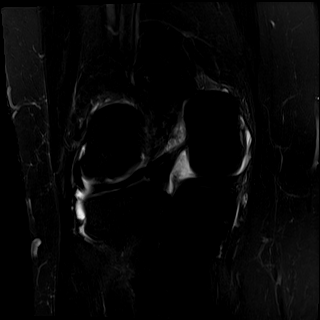
[im 17/28]
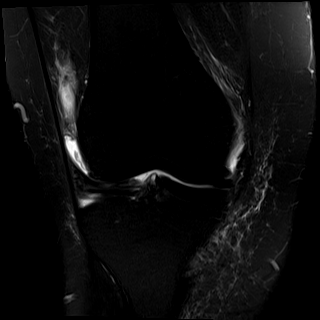
[im 22/28]
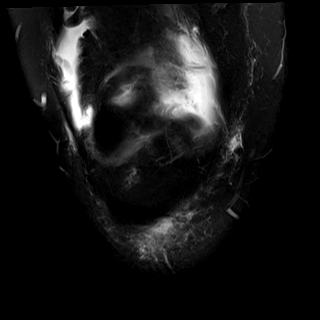
[im 28/28]
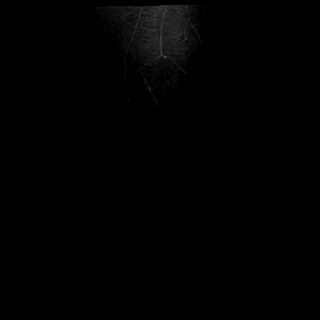

[Series 8: T1 · coronal · right · 4.0mm · 0.47mm/px · 6 of 28 slices shown]
[im 1/28]
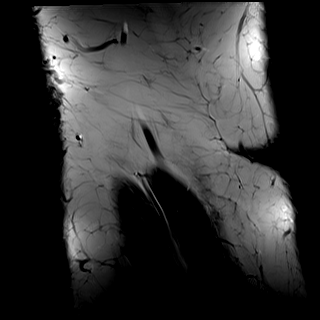
[im 6/28]
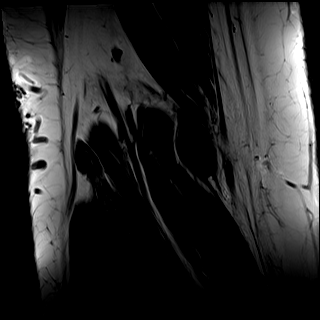
[im 11/28]
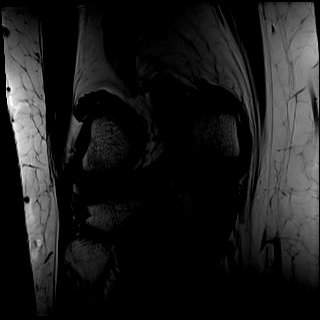
[im 17/28]
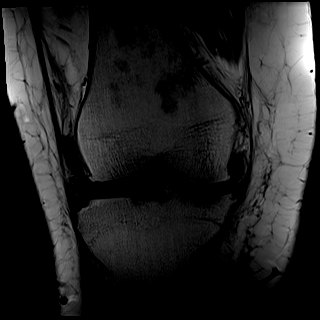
[im 22/28]
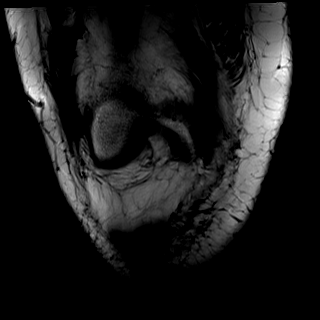
[im 28/28]
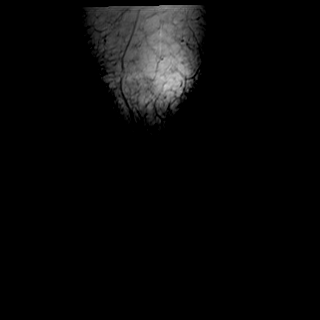

[Series 9: PD fat-sat · coronal · right · 3.0mm · 0.47mm/px · 7 of 33 slices shown (1 of 2)]
[im 1/33]
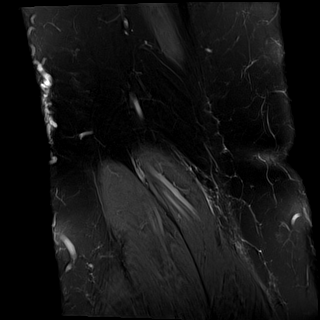
[im 6/33]
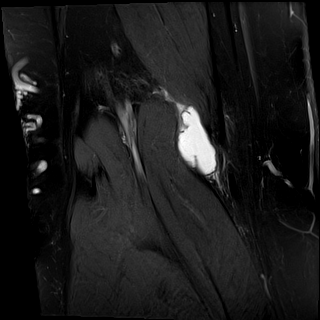
[im 11/33]
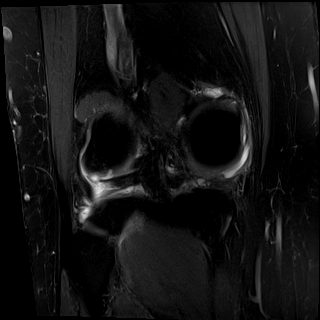
[im 17/33]
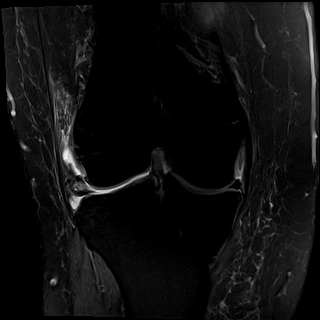
[im 22/33]
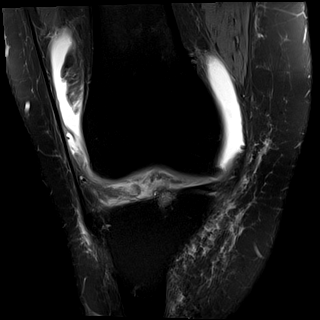
[im 27/33]
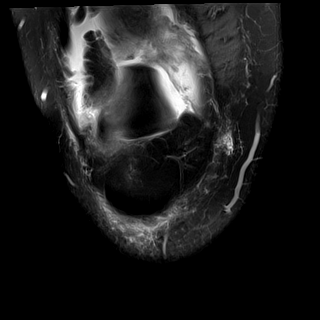
[im 33/33]
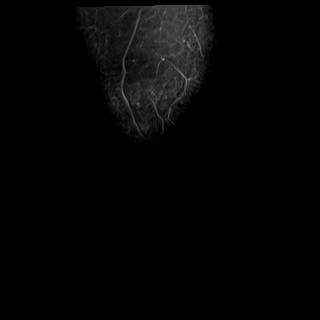

[Series 10: PD fat-sat · sagittal · right · 3.0mm · 0.47mm/px · 6 of 29 slices shown (2 of 2)]
[im 1/29]
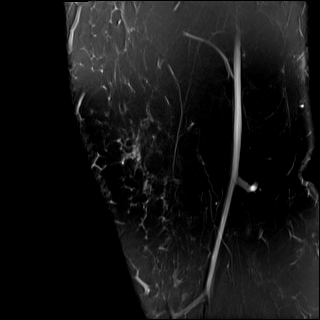
[im 6/29]
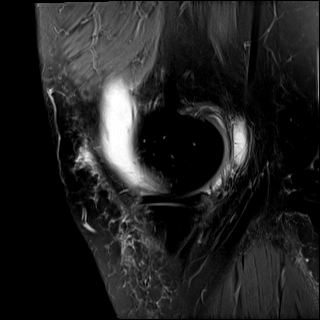
[im 12/29]
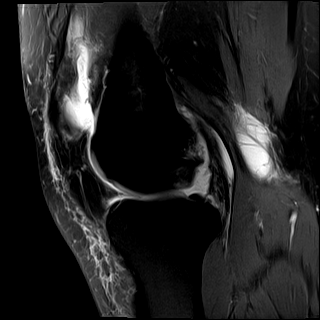
[im 17/29]
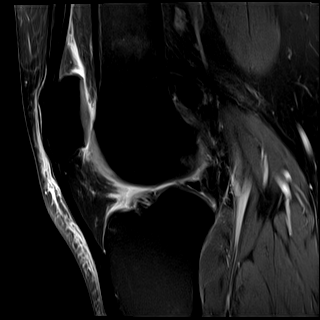
[im 23/29]
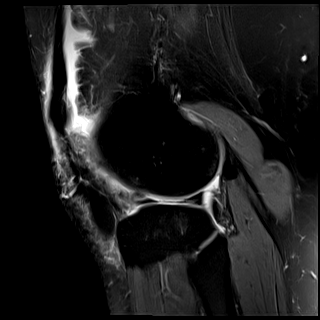
[im 29/29]
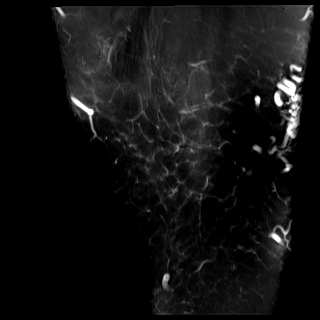

[Series 11: T2 fat-sat · sagittal · right · 3.0mm · 0.47mm/px · 6 of 29 slices shown (3 of 3)]
[im 1/29]
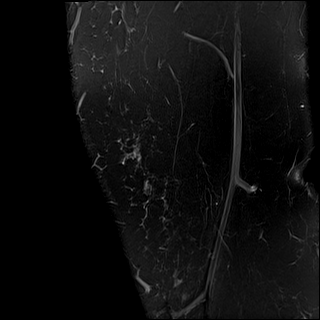
[im 6/29]
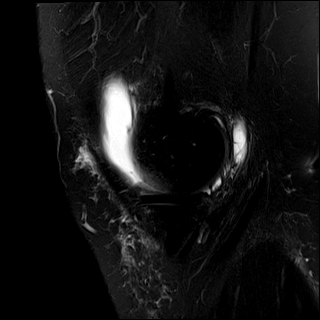
[im 12/29]
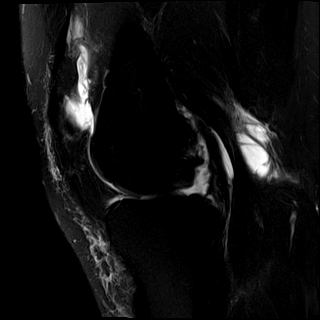
[im 17/29]
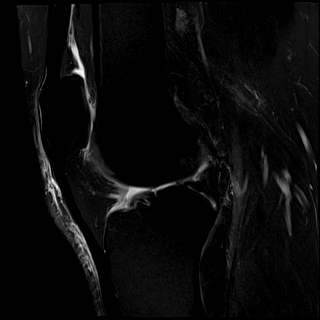
[im 23/29]
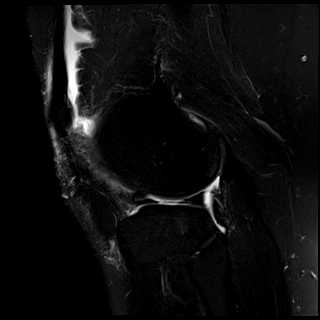
[im 29/29]
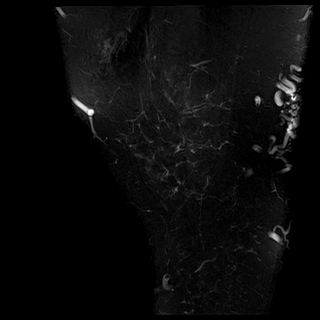

[40 of 40 positions shown; findings below may reference images not displayed]

FINDINGS: MENISCI

Medial meniscus:  Intact with normal morphology.

Lateral meniscus: Prominent intrasubstance signal within the
anterior horn and body associated with mild meniscal expansion. No
discrete meniscal tear or displaced meniscal fragment.

LIGAMENTS

Cruciates:  Intact.  Mild ACL mucoid degeneration.

Collaterals: Intact. Small amount of fluid within the pes answering
bursa.

CARTILAGE

Patellofemoral:  Preserved.

Medial:  Preserved.

Lateral: Mild chondral thinning and surface irregularity involving
the posterior aspect of the lateral femoral condyle.

MISCELLANEOUS

Joint:  Moderate-sized joint effusion with synovial irregularity.

Popliteal Fossa:  Moderate-sized Baker's cyst.

Extensor Mechanism:  Intact.

Bones: No acute or significant extra-articular osseous findings.
There are small intraosseous ganglia anteriorly in the tibia at the
ACL insertion.

Other: Mild prepatellar subcutaneous edema. Mildly prominent
superficial varicosities, largest laterally.
IMPRESSION: 1. Prominent intrasubstance degenerative signal within the anterior
horn and body of the lateral meniscus with mild meniscal expansion.
No well-defined meniscal tear or displaced meniscal fragment.
2. ACL mucoid degeneration with intraosseous ganglion anteriorly in
the proximal tibia.
3. Grade 3 chondromalacia involving the posterior nonweightbearing
aspect of the lateral femoral condyle.
4. Moderate-sized joint effusion and Baker's cyst.
5. The medial meniscus, cruciate and collateral ligaments are
intact.

## 2021-12-12 ENCOUNTER — Other Ambulatory Visit: Payer: Self-pay

## 2021-12-12 ENCOUNTER — Ambulatory Visit (INDEPENDENT_AMBULATORY_CARE_PROVIDER_SITE_OTHER): Payer: Medicaid Other | Admitting: Podiatry

## 2021-12-12 DIAGNOSIS — L989 Disorder of the skin and subcutaneous tissue, unspecified: Secondary | ICD-10-CM

## 2021-12-12 NOTE — Progress Notes (Signed)
° °  Subjective: 46 y.o. female presenting to the office today for evaluation of a symptomatic callus to the fifth digit of the left foot.  Patient states it is very painful and tender especially with closed toed shoes.  She would like to have it treated today.  She presents for further treatment evaluation.   No past medical history on file.  Past Surgical History:  Procedure Laterality Date   BREAST SURGERY     ECTOPIC PREGNANCY SURGERY     PARTIAL HYSTERECTOMY     Allergies  Allergen Reactions   Latex      Objective:  Physical Exam General: Alert and oriented x3 in no acute distress  Dermatology: Hyperkeratotic lesion(s) present on the dorsal aspect of the left fifth toe. Pain on palpation with a central nucleated core noted. Skin is warm, dry and supple bilateral lower extremities. Negative for open lesions or macerations.  Vascular: Palpable pedal pulses bilaterally. No edema or erythema noted. Capillary refill within normal limits.  Neurological: Epicritic and protective threshold grossly intact bilaterally.   Musculoskeletal Exam: Pain on palpation at the keratotic lesion(s) noted. Range of motion within normal limits bilateral. Muscle strength 5/5 in all groups bilateral.  Assessment: 1.  Symptomatic corn left fifth toe   Plan of Care:  1. Patient evaluated 2. Excisional debridement of keratoic lesion(s) using a chisel blade was performed without incident.  3. Dressed area with light dressing. 4.  Recommend OTC corn callus remover daily  5.  Patient is to return to the clinic PRN.   Edrick Kins, DPM [facility]  Dr. Edrick Kins, [place]                                        [place], [place] [address]                Office [phone]  Fax [phone]

## 2023-07-26 ENCOUNTER — Ambulatory Visit: Payer: Managed Care, Other (non HMO) | Admitting: Internal Medicine

## 2023-08-06 ENCOUNTER — Ambulatory Visit (INDEPENDENT_AMBULATORY_CARE_PROVIDER_SITE_OTHER): Payer: Medicaid Other | Admitting: Podiatry

## 2023-08-06 DIAGNOSIS — Z91199 Patient's noncompliance with other medical treatment and regimen due to unspecified reason: Secondary | ICD-10-CM

## 2023-08-06 NOTE — Progress Notes (Signed)
No show for apointment

## 2023-08-30 ENCOUNTER — Telehealth: Payer: Self-pay

## 2023-08-30 NOTE — Telephone Encounter (Signed)
Patient would like to be on wait list for a sooner new patient appointment.

## 2023-09-03 ENCOUNTER — Encounter: Payer: Self-pay | Admitting: Podiatry

## 2023-09-03 ENCOUNTER — Ambulatory Visit (INDEPENDENT_AMBULATORY_CARE_PROVIDER_SITE_OTHER): Payer: Medicaid Other

## 2023-09-03 ENCOUNTER — Ambulatory Visit (INDEPENDENT_AMBULATORY_CARE_PROVIDER_SITE_OTHER): Payer: Medicaid Other | Admitting: Podiatry

## 2023-09-03 VITALS — HR 81 | Temp 96.9°F | Ht 72.0 in | Wt 280.0 lb

## 2023-09-03 DIAGNOSIS — M722 Plantar fascial fibromatosis: Secondary | ICD-10-CM | POA: Diagnosis not present

## 2023-09-03 DIAGNOSIS — M79672 Pain in left foot: Secondary | ICD-10-CM

## 2023-09-03 MED ORDER — MELOXICAM 15 MG PO TABS: 15.0000 mg | ORAL_TABLET | Freq: Every day | ORAL | 1 refills | Status: DC

## 2023-09-03 MED ORDER — METHYLPREDNISOLONE 4 MG PO TBPK: ORAL_TABLET | ORAL | 0 refills | Status: DC

## 2023-09-03 MED ORDER — BETAMETHASONE SOD PHOS & ACET 6 (3-3) MG/ML IJ SUSP: 3.0000 mg | Freq: Once | INTRAMUSCULAR | Status: DC

## 2023-09-03 NOTE — Patient Instructions (Signed)
Arch supports available on [employer]: -Power steps -Superfeet -ProTalus  Also look up 'recovery slides' also available on [employer]

## 2023-09-03 NOTE — Progress Notes (Signed)
   Chief Complaint  Patient presents with   Foot Pain    Patient is here for bilateral heel pain and foot pain sharp shooting pains even right out of bed     Subjective: 47 y.o. female presenting today for onset of new pain associated to the bilateral heels for several months now.  Idiopathic onset.  No history of injury.  Patient works on her feet the majority of the day.  She has not tried anything for treatment.   History reviewed. No pertinent past medical history.   Objective: Physical Exam General: The patient is alert and oriented x3 in no acute distress.  Dermatology: Skin is warm, dry and supple bilateral lower extremities. Negative for open lesions or macerations bilateral.   Vascular: Dorsalis Pedis and Posterior Tibial pulses palpable bilateral.  Capillary fill time is immediate to all digits.  Neurological: Grossly intact via light touch  Musculoskeletal: Tenderness to palpation to the plantar aspect of the bilateral heels along the plantar fascia. All other joints range of motion within normal limits bilateral. Strength 5/5 in all groups bilateral.   Radiographic exam B/L feet 09/03/2023: Normal osseous mineralization. Joint spaces preserved. No fracture/dislocation/boney destruction. No other soft tissue abnormalities or radiopaque foreign bodies.  Plantar heel spurs noted bilateral  Assessment: 1. plantar fasciitis bilateral feet  Plan of Care:  1. Patient evaluated. Xrays reviewed.   2. Injection of 0.5cc Celestone soluspan injected into the bilateral heels.  3. Rx for Medrol Dose Pak placed 4. Rx for Meloxicam ordered for patient. 5.  Recommend OTC arch supports/orthotics available on [employer] 6. Instructed patient regarding therapies and modalities at home to alleviate symptoms.  7. Return to clinic in 4 weeks.    *works on her feet all day as a chef  Felecia Shelling, DPM [facility]  Dr. Felecia Shelling, DPM    [address] [place], [place] [address]                Office [phone]  Fax [phone]

## 2023-10-08 ENCOUNTER — Ambulatory Visit: Payer: Medicaid Other | Admitting: Podiatry

## 2023-11-19 ENCOUNTER — Ambulatory Visit: Payer: Medicaid Other | Admitting: Podiatry

## 2023-11-19 ENCOUNTER — Encounter: Payer: Self-pay | Admitting: Internal Medicine

## 2023-11-19 ENCOUNTER — Ambulatory Visit (INDEPENDENT_AMBULATORY_CARE_PROVIDER_SITE_OTHER): Payer: Medicaid Other | Admitting: Internal Medicine

## 2023-11-19 ENCOUNTER — Ambulatory Visit: Payer: Medicaid Other | Admitting: Internal Medicine

## 2023-11-19 VITALS — BP 118/86 | HR 76 | Resp 16 | Ht 70.5 in | Wt 277.0 lb

## 2023-11-19 DIAGNOSIS — G8929 Other chronic pain: Secondary | ICD-10-CM

## 2023-11-19 DIAGNOSIS — M25561 Pain in right knee: Secondary | ICD-10-CM | POA: Diagnosis not present

## 2023-11-19 DIAGNOSIS — E669 Obesity, unspecified: Secondary | ICD-10-CM

## 2023-11-19 DIAGNOSIS — Z7689 Persons encountering health services in other specified circumstances: Secondary | ICD-10-CM | POA: Diagnosis not present

## 2023-11-19 NOTE — Patient Instructions (Signed)
Drink a glass of water before every meal Drink 6-8 glasses of water daily Eat three meals daily Eat a protein and healthy fat with every meal (eggs,fish, chicken, turkey and limit red meats) Eat 5 servings of vegetables daily, mix the colors Eat 2 servings of fruit daily with skin, if skin is edible Use smaller plates Put food/utensils down as you chew and swallow each bite Eat at a table with friends/family at least once daily, no TV Do not eat in front of the TV  Recent studies show that people who consume all of their calories in a 12 hour period lose weight more efficiently.  For example, if you eat your first meal at 7:00 a.m., your last meal of the day should be completed by 7:00 p.m. 

## 2023-11-19 NOTE — Progress Notes (Signed)
Subjective:    Patient ID: Diana Gray, female   DOB: 1976-11-05, 47 y.o.   MRN: [id number]   HPI  Here to establish.     Weight concerns:  Is looking for a way to get weight off.  Was going to Dr. Pecola Leisure previously.  Would like feedback on her labs, etc.   Diet Up at 5 a.m.  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 First intake:  9:15 a.m.:  eats a salad--multiple veggies.  [place] meat and boiled egg and perhaps pineapple or strawberries. White cranberry juice or soda--8 oz.  Skips lunch frequently and then does not eat until home until 6 p.m., though home at 1:30 -2 p.m. Dinner:  meatloaf, mashed potatoes, gravy, green beans.  Soda or [employer]. Does not feel generally that serving sizes should be adjusted, but at times she does.   Can be snacking all day long.   Lives at home with husband and son.  Son is right tackle for [employer].   Feels good at weight 225 lbs  2.  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  Does not recall ever having genital lesions.  Describes having blood work done in 2019 and has been on Valtrex.  She was uncertain what she was supposed to do with it.  She would only take the med once weekly as she was unsure as to what to do.  Took last dose earlier this month.  Has not been seen for a year.   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  Has never had a lesion swabbed for culture before.       No outpatient medications have been marked as taking for the 11/19/23 encounter (Office Visit) with Julieanne Manson, MD.    Allergies  Allergen Reactions   Latex      Review of Systems    Objective:   BP 118/86 (BP Location: Left Arm, Patient Position: Sitting, Cuff Size: Normal)   Pulse 76   Resp 16   Ht 5' 10.5" (1.791 m)   Wt 277 lb (125.6 kg)   BMI 39.18 kg/m   Physical Exam Constitutional:      Appearance: She is obese.  HENT:     Head: Normocephalic and  atraumatic.     Right Ear: Tympanic membrane normal.     Left Ear: Tympanic membrane normal.     Nose: Nose normal.     Mouth/Throat:     Mouth: Mucous membranes are moist.     Pharynx: Oropharynx is clear.  Eyes:     Extraocular Movements: Extraocular movements intact.     Conjunctiva/sclera: Conjunctivae normal.     Pupils: Pupils are equal, round, and reactive to light.  Neck:     Thyroid: No thyromegaly (possible a bit full, but prominent SCM muscles and excess overlying skin fold.).  Cardiovascular:     Rate and Rhythm: Normal rate and regular rhythm.     Heart sounds: S1 normal and S2 normal. No murmur heard.    No friction rub. No S3 or S4 sounds.     Comments: No carotid bruits.  Carotid, radial, femoral, DP and PT pulses normal and equal.   Pulmonary:     Effort: Pulmonary effort is normal.     Breath sounds: Normal breath sounds and air entry.  Abdominal:     General: Bowel sounds are normal.     Palpations: Abdomen is  soft. There is no hepatomegaly, splenomegaly or mass.     Tenderness: There is no abdominal tenderness.     Hernia: No hernia is present.  Musculoskeletal:     Cervical back: Normal range of motion and neck supple.     Right lower leg: No edema.     Left lower leg: No edema.  Lymphadenopathy:     Head:     Right side of head: No submental or submandibular adenopathy.     Left side of head: No submental or submandibular adenopathy.     Cervical: No cervical adenopathy.  Skin:    General: Skin is warm.     Findings: No rash.  Neurological:     Mental Status: She is alert.       Assessment & Plan   Concern for herpes simplex as she does not understand how this was diagnosed and not clear she needs to take suppressive therapy.  She is relatively newly married.  She has not been taking the Valtrex appropriately for adequate suppressive therapy, but again, not clear she needs.  Send for records from [facility].  She is to call if she does not  hear anything back in 30 days regarding this issue.  2.  Obesity:  Encouraged working on lifestyle changes and making specific weekly goals for physical activity and dietary changes.  Mainly seems she may need to work on portion sizes and snacking on salty snacks, but to start with sodas and juices first.  With her chronic right knee issues, discussed water exercise and recumbent bike, though walking the dog also an option.    3.  HM:  check Fasting labs along with TSH prior to CPE with pap, next available.

## 2023-11-26 NOTE — Telephone Encounter (Signed)
 Patient has been seen.

## 2023-11-28 ENCOUNTER — Ambulatory Visit: Payer: Medicaid Other | Admitting: Podiatry

## 2023-11-29 ENCOUNTER — Other Ambulatory Visit: Payer: Medicaid Other

## 2023-12-24 ENCOUNTER — Ambulatory Visit: Payer: Medicaid Other | Admitting: Podiatry

## 2024-01-10 ENCOUNTER — Encounter: Payer: Medicaid Other | Admitting: Internal Medicine

## 2024-01-14 ENCOUNTER — Telehealth: Payer: Self-pay

## 2024-01-14 NOTE — Telephone Encounter (Signed)
 Patient would like her CPE appointment to be sooner than April.  We will call patient if there is a cancellation.

## 2024-01-18 NOTE — Telephone Encounter (Signed)
Called patient to offer appointment, patient did not answer.

## 2024-01-21 ENCOUNTER — Ambulatory Visit (INDEPENDENT_AMBULATORY_CARE_PROVIDER_SITE_OTHER): Payer: Medicaid Other | Admitting: Podiatry

## 2024-01-21 ENCOUNTER — Encounter: Payer: Self-pay | Admitting: Podiatry

## 2024-01-21 DIAGNOSIS — M722 Plantar fascial fibromatosis: Secondary | ICD-10-CM | POA: Diagnosis not present

## 2024-01-21 NOTE — Progress Notes (Signed)
   Chief Complaint  Patient presents with   Foot Pain    Follow up PF bilateral   "They are better, but still sore. I got the insoles and those "half-sock" things."   Toe Pain    5th toe left - would like corn trimmed up again    Subjective: 48 y.o. female presenting today for follow-up evaluation of plantar fasciitis bilateral.  Patient states that she does go barefoot around the house throughout the majority of the day.  She also has developed a symptomatic skin lesion to the fifth digit of the left foot   Past Medical History:  Diagnosis Date   Right knee pain      Objective: Physical Exam General: The patient is alert and oriented x3 in no acute distress.  Dermatology: Skin is warm, dry and supple bilateral lower extremities. Negative for open lesions or macerations bilateral.  Hyperkeratotic skin lesion noted to the fifth digit of the left foot  Vascular: Dorsalis Pedis and Posterior Tibial pulses palpable bilateral.  Capillary fill time is immediate to all digits.  Neurological: Grossly intact via light touch  Musculoskeletal: Tenderness to palpation to the plantar aspect of the bilateral heels along the plantar fascia. All other joints range of motion within normal limits bilateral. Strength 5/5 in all groups bilateral.   Radiographic exam B/L feet 09/03/2023: Normal osseous mineralization. Joint spaces preserved. No fracture/dislocation/boney destruction. No other soft tissue abnormalities or radiopaque foreign bodies.  Plantar heel spurs noted bilateral  Assessment: 1. plantar fasciitis bilateral feet 2.  Benign skin lesion fifth digit left foot  Plan of Care:  -Advised against going barefoot.  Recommend good supportive tennis shoes and sneakers even around the house -Patient states that the injections and anti-inflammatories do not help alleviate any of her pain -Excisional debridement of the benign skin lesion was performed today using a 312 scalpel without  incident or bleeding.  Patient felt some relief -Return to clinic as needed  *works on her feet all day as a chef  Felecia Shelling, DPM [facility]  Dr. Felecia Shelling, DPM    [address] [place], [place] [address]                Office [phone]  Fax [phone]

## 2024-02-19 DIAGNOSIS — E119 Type 2 diabetes mellitus without complications: Secondary | ICD-10-CM

## 2024-02-19 HISTORY — DX: Type 2 diabetes mellitus without complications: E11.9

## 2024-02-28 ENCOUNTER — Other Ambulatory Visit (INDEPENDENT_AMBULATORY_CARE_PROVIDER_SITE_OTHER): Payer: Medicaid Other

## 2024-02-28 DIAGNOSIS — Z0000 Encounter for general adult medical examination without abnormal findings: Secondary | ICD-10-CM

## 2024-02-29 LAB — COMPREHENSIVE METABOLIC PANEL WITH GFR
ALT: 21 IU/L (ref 0–32)
AST: 13 IU/L (ref 0–40)
Albumin: 4.3 g/dL (ref 3.9–4.9)
Alkaline Phosphatase: 127 IU/L — ABNORMAL HIGH (ref 44–121)
BUN/Creatinine Ratio: 18 (ref 9–23)
BUN: 12 mg/dL (ref 6–24)
Bilirubin Total: 0.5 mg/dL (ref 0.0–1.2)
CO2: 22 mmol/L (ref 20–29)
Calcium: 9.6 mg/dL (ref 8.7–10.2)
Chloride: 96 mmol/L (ref 96–106)
Creatinine, Ser: 0.68 mg/dL (ref 0.57–1.00)
Globulin, Total: 2.8 g/dL (ref 1.5–4.5)
Glucose: 329 mg/dL — ABNORMAL HIGH (ref 70–99)
Potassium: 4.6 mmol/L (ref 3.5–5.2)
Sodium: 137 mmol/L (ref 134–144)
Total Protein: 7.1 g/dL (ref 6.0–8.5)
eGFR: 107 mL/min/{1.73_m2} (ref >59)

## 2024-02-29 LAB — CBC WITH DIFFERENTIAL/PLATELET
Basophils Absolute: 0.1 10*3/uL (ref 0.0–0.2)
Basos: 1 %
EOS (ABSOLUTE): 0.1 10*3/uL (ref 0.0–0.4)
Eos: 2 %
Hematocrit: 43.6 % (ref 34.0–46.6)
Hemoglobin: 14.2 g/dL (ref 11.1–15.9)
Immature Grans (Abs): 0 10*3/uL (ref 0.0–0.1)
Immature Granulocytes: 0 %
Lymphocytes Absolute: 3.7 10*3/uL — ABNORMAL HIGH (ref 0.7–3.1)
Lymphs: 45 %
MCH: 29.7 pg (ref 26.6–33.0)
MCHC: 32.6 g/dL (ref 31.5–35.7)
MCV: 91 fL (ref 79–97)
Monocytes Absolute: 0.4 10*3/uL (ref 0.1–0.9)
Monocytes: 4 %
Neutrophils Absolute: 3.9 10*3/uL (ref 1.4–7.0)
Neutrophils: 48 %
Platelets: 335 10*3/uL (ref 150–450)
RBC: 4.78 x10E6/uL (ref 3.77–5.28)
RDW: 12.2 % (ref 11.7–15.4)
WBC: 8.2 10*3/uL (ref 3.4–10.8)

## 2024-02-29 LAB — LIPID PANEL W/O CHOL/HDL RATIO
Cholesterol, Total: 150 mg/dL (ref 100–199)
HDL: 48 mg/dL (ref >39)
LDL Chol Calc (NIH): 79 mg/dL (ref 0–99)
Triglycerides: 128 mg/dL (ref 0–149)
VLDL Cholesterol Cal: 23 mg/dL (ref 5–40)

## 2024-02-29 LAB — TSH: TSH: 1.01 u[IU]/mL (ref 0.450–4.500)

## 2024-03-03 ENCOUNTER — Telehealth: Admitting: Physician Assistant

## 2024-03-03 DIAGNOSIS — B9689 Other specified bacterial agents as the cause of diseases classified elsewhere: Secondary | ICD-10-CM | POA: Diagnosis not present

## 2024-03-03 DIAGNOSIS — R051 Acute cough: Secondary | ICD-10-CM | POA: Diagnosis not present

## 2024-03-03 DIAGNOSIS — J028 Acute pharyngitis due to other specified organisms: Secondary | ICD-10-CM | POA: Diagnosis not present

## 2024-03-03 MED ORDER — PROMETHAZINE-DM 6.25-15 MG/5ML PO SYRP: 5.0000 mL | ORAL_SOLUTION | Freq: Four times a day (QID) | ORAL | 0 refills | Status: DC | PRN

## 2024-03-03 MED ORDER — FLUTICASONE PROPIONATE 50 MCG/ACT NA SUSP: 2.0000 | Freq: Every day | NASAL | 0 refills | Status: DC

## 2024-03-03 MED ORDER — AZITHROMYCIN 250 MG PO TABS: ORAL_TABLET | ORAL | 0 refills | Status: AC

## 2024-03-03 NOTE — Progress Notes (Signed)
 Virtual Visit Consent   Diana Gray, you are scheduled for a virtual visit with a [facility] provider today. Just as with appointments in the office, your consent must be obtained to participate. Your consent will be active for this visit and any virtual visit you may have with one of our providers in the next 365 days. If you have a MyChart account, a copy of this consent can be sent to you electronically.  As this is a virtual visit, video technology does not allow for your provider to perform a traditional examination. This may limit your provider's ability to fully assess your condition. If your provider identifies any concerns that need to be evaluated in person or the need to arrange testing (such as labs, EKG, etc.), we will make arrangements to do so. Although advances in technology are sophisticated, we cannot ensure that it will always work on either your end or our end. If the connection with a video visit is poor, the visit may have to be switched to a telephone visit. With either a video or telephone visit, we are not always able to ensure that we have a secure connection.  By engaging in this virtual visit, you consent to the provision of healthcare and authorize for your insurance to be billed (if applicable) for the services provided during this visit. Depending on your insurance coverage, you may receive a charge related to this service.  I need to obtain your verbal consent now. Are you willing to proceed with your visit today? Diana Gray has provided verbal consent on 03/03/2024 for a virtual visit (video or telephone). Angelia Kelp, PA-C  Date: 03/03/2024 11:00 AM   Virtual Visit via Video Note   I, Angelia Kelp, connected with  Diana Gray  ([id number], [birth year]) on 03/03/24 at 10:45 AM EDT by a video-enabled telemedicine application and verified that I am speaking with the correct person using two identifiers.  Location: Patient: Virtual Visit  Location Patient: Home Provider: Virtual Visit Location Provider: Home Office   I discussed the limitations of evaluation and management by telemedicine and the availability of in person appointments. The patient expressed understanding and agreed to proceed.    History of Present Illness: Diana Gray is a 48 y.o. who identifies as a female who was assigned female at birth, and is being seen today for sore throat and cough.  HPI: Sore Throat  This is a new problem. The current episode started yesterday. The problem has been gradually worsening. There has been no fever. The pain is moderate. Associated symptoms include congestion, coughing, headaches, a hoarse voice, swollen glands and trouble swallowing. Pertinent negatives include no abdominal pain, drooling, ear discharge, ear pain, plugged ear sensation or shortness of breath. Associated symptoms comments: Started with a tickle in the throat, fatigue, lightheaded at work today; has been having chronic post nasal drainage. She has had exposure to strep. 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 Treatments tried: hot tea, pickle juice. The treatment provided no relief.  Negative at home Covid test given through work    Problems:  Patient Active Problem List   Diagnosis Date Noted   Obesity (BMI 35.0-39.9 without comorbidity) 11/19/2023   Right knee pain     Allergies:  Allergies  Allergen Reactions   Latex    Medications:  Current Outpatient Medications:    azithromycin (ZITHROMAX) 250 MG tablet, Take 2 tablets on day 1, then  1 tablet daily on days 2 through 5, Disp: 6 tablet, Rfl: 0   fluticasone (FLONASE) 50 MCG/ACT nasal spray, Place 2 sprays into both nostrils daily., Disp: 16 g, Rfl: 0   promethazine-dextromethorphan (PROMETHAZINE-DM) 6.25-15 MG/5ML syrup, Take 5 mLs by mouth 4 (four) times daily as needed., Disp: 118 mL, Rfl: 0   valACYclovir (VALTREX) 500 MG tablet,  Take 500 mg by mouth daily., Disp: , Rfl:   Observations/Objective: Patient is well-developed, well-nourished in no acute distress.  Resting comfortably at home.  Head is normocephalic, atraumatic.  No labored breathing.  Speech is clear and coherent with logical content.  Patient is alert and oriented at baseline.    Assessment and Plan: 1. Acute bacterial pharyngitis (Primary) - azithromycin (ZITHROMAX) 250 MG tablet; Take 2 tablets on day 1, then 1 tablet daily on days 2 through 5  Dispense: 6 tablet; Refill: 0 - fluticasone (FLONASE) 50 MCG/ACT nasal spray; Place 2 sprays into both nostrils daily.  Dispense: 16 g; Refill: 0  2. Acute cough - promethazine-dextromethorphan (PROMETHAZINE-DM) 6.25-15 MG/5ML syrup; Take 5 mLs by mouth 4 (four) times daily as needed.  Dispense: 118 mL; Refill: 0  - Suspect bacterial pharyngitis - Zpack prescribed - Add Flonase for post-nasal drainage - Promethazine DM for cough - Tylenol and Ibuprofen alternating every 4 hours - Salt water gargles - Chloraseptic spray - Liquid and soft food diet - Push fluids - New toothbrush in 3 days - Work note provided - Seek in person evaluation if not improving or if symptoms worsen   Follow Up Instructions: I discussed the assessment and treatment plan with the patient. The patient was provided an opportunity to ask questions and all were answered. The patient agreed with the plan and demonstrated an understanding of the instructions.  A copy of instructions were sent to the patient via MyChart unless otherwise noted below.    The patient was advised to call back or seek an in-person evaluation if the symptoms worsen or if the condition fails to improve as anticipated.    Angelia Kelp, PA-C

## 2024-03-03 NOTE — Patient Instructions (Signed)
 Diana Gray, thank you for joining Angelia Kelp, PA-C for today's virtual visit.  While this provider is not your primary care provider (PCP), if your PCP is located in our provider database this encounter information will be shared with them immediately following your visit.   A [facility] MyChart account gives you access to today's visit and all your visits, tests, and labs performed at [facility] " click here if you don't have a [facility] MyChart account or go to [email or web address]  Consent: (Patient) Diana Gray provided verbal consent for this virtual visit at the beginning of the encounter.  Current Medications:  Current Outpatient Medications:    azithromycin (ZITHROMAX) 250 MG tablet, Take 2 tablets on day 1, then 1 tablet daily on days 2 through 5, Disp: 6 tablet, Rfl: 0   fluticasone (FLONASE) 50 MCG/ACT nasal spray, Place 2 sprays into both nostrils daily., Disp: 16 g, Rfl: 0   promethazine-dextromethorphan (PROMETHAZINE-DM) 6.25-15 MG/5ML syrup, Take 5 mLs by mouth 4 (four) times daily as needed., Disp: 118 mL, Rfl: 0   valACYclovir (VALTREX) 500 MG tablet, Take 500 mg by mouth daily., Disp: , Rfl:    Medications ordered in this encounter:  Meds ordered this encounter  Medications   azithromycin (ZITHROMAX) 250 MG tablet    Sig: Take 2 tablets on day 1, then 1 tablet daily on days 2 through 5    Dispense:  6 tablet    Refill:  0    Supervising Provider:   LAMPTEY, PHILIP O [[id number]]   fluticasone (FLONASE) 50 MCG/ACT nasal spray    Sig: Place 2 sprays into both nostrils daily.    Dispense:  16 g    Refill:  0    Supervising Provider:   Corine Dice [[id number]]   promethazine-dextromethorphan (PROMETHAZINE-DM) 6.25-15 MG/5ML syrup    Sig: Take 5 mLs by mouth 4 (four) times daily as needed.    Dispense:  118 mL    Refill:  0    Supervising Provider:   Corine Dice [[id number]]     *If you need refills on other medications prior  to your next appointment, please contact your pharmacy*  Follow-Up: Call back or seek an in-person evaluation if the symptoms worsen or if the condition fails to improve as anticipated.   Virtual Care [phone]  Other Instructions Pharyngitis  Pharyngitis is inflammation of the throat (pharynx). It is a very common cause of sore throat. Pharyngitis can be caused by a bacteria, but it is usually caused by a virus. Most cases of pharyngitis get better on their own without treatment. What are the causes? This condition may be caused by: Infection by viruses (viral). Viral pharyngitis spreads easily from person to person (is contagious) through coughing, sneezing, and sharing of personal items or utensils such as cups, forks, spoons, and toothbrushes. Infection by bacteria (bacterial). Bacterial pharyngitis may be spread by touching the nose or face after coming in contact with the bacteria, or through close contact, such as kissing. Allergies. Allergies can cause buildup of mucus in the throat (post-nasal drip), leading to inflammation and irritation. Allergies can also cause blocked nasal passages, forcing breathing through the mouth, which dries and irritates the throat. What increases the risk? You are more likely to develop this condition if: You are 16-47 years old. You are exposed to crowded environments such as daycare, school, or dormitory living. You live in a cold climate. You have a weakened disease-fighting (immune) system. What are  the signs or symptoms? Symptoms of this condition vary by the cause. Common symptoms of this condition include: Sore throat. Fatigue. Low-grade fever. Stuffy nose (nasal congestion) and cough. Headache. Other symptoms may include: Glands in the neck (lymph nodes) that are swollen. Skin rashes. Plaque-like film on the throat or tonsils. This is often a symptom of bacterial pharyngitis. Vomiting. Red, itchy eyes  (conjunctivitis). Loss of appetite. Joint pain and muscle aches. Enlarged tonsils. How is this diagnosed? This condition may be diagnosed based on your medical history and a physical exam. Your health care provider will ask you questions about your illness and your symptoms. A swab of your throat may be done to check for bacteria (rapid strep test). Other lab tests may also be done, depending on the suspected cause, but these are rare. How is this treated? Many times, treatment is not needed for this condition. Pharyngitis usually gets better in 3-4 days without treatment. Bacterial pharyngitis may be treated with antibiotic medicines. Follow these instructions at home: Medicines Take over-the-counter and prescription medicines only as told by your health care provider. If you were prescribed an antibiotic medicine, take it as told by your health care provider. Do not stop taking the antibiotic even if you start to feel better. Use throat sprays to soothe your throat as told by your health care provider. Children can get pharyngitis. Do not give your child aspirin because of the association with Reye's syndrome. Managing pain To help with pain, try: Sipping warm liquids, such as broth, herbal tea, or warm water. Eating or drinking cold or frozen liquids, such as frozen ice pops. Gargling with a mixture of salt and water 3-4 times a day or as needed. To make salt water, completely dissolve -1 tsp (3-6 g) of salt in 1 cup (237 mL) of warm water. Sucking on hard candy or throat lozenges. Putting a cool-mist humidifier in your bedroom at night to moisten the air. Sitting in the bathroom with the door closed for 5-10 minutes while you run hot water in the shower.  General instructions  Do not use any products that contain nicotine or tobacco. These products include cigarettes, chewing tobacco, and vaping devices, such as e-cigarettes. If you need help quitting, ask your health care  provider. Rest as told by your health care provider. Drink enough fluid to keep your urine pale yellow. How is this prevented? To help prevent becoming infected or spreading infection: Wash your hands often with soap and water for at least 20 seconds. If soap and water are not available, use hand sanitizer. Do not touch your eyes, nose, or mouth with unwashed hands, and wash hands after touching these areas. Do not share cups or eating utensils. Avoid close contact with people who are sick. Contact a health care provider if: You have large, tender lumps in your neck. You have a rash. You cough up green, yellow-brown, or bloody mucus. Get help right away if: Your neck becomes stiff. You drool or are unable to swallow liquids. You cannot drink or take medicines without vomiting. You have severe pain that does not go away, even after you take medicine. You have trouble breathing, and it is not caused by a stuffy nose. You have new pain and swelling in your joints such as the knees, ankles, wrists, or elbows. These symptoms may represent a serious problem that is an emergency. Do not wait to see if the symptoms will go away. Get medical help right away. Call your local emergency  services (911 in the [place] Do not drive yourself to the hospital. Summary Pharyngitis is redness, pain, and swelling (inflammation) of the throat (pharynx). While pharyngitis can be caused by a bacteria, the most common causes are viral. Most cases of pharyngitis get better on their own without treatment. Bacterial pharyngitis is treated with antibiotic medicines. This information is not intended to replace advice given to you by your health care provider. Make sure you discuss any questions you have with your health care provider. Document Revised: 02/02/2021 Document Reviewed: 02/02/2021 Elsevier Patient Education  2024 Elsevier Inc.   If you have been instructed to have an in-person evaluation today at a local  Urgent Care facility, please use the link below. It will take you to a list of all of our available [facility], including address, phone number and hours of operation. Please do not delay care.  [facility]  If you or a family member do not have a primary care provider, use the link below to schedule a visit and establish care. When you choose a [facility] primary care physician or advanced practice provider, you gain a long-term partner in health. Find a Primary Care Provider  Learn more about [facility]'s in-office and virtual care options: [facility] - Get Care Now

## 2024-03-04 NOTE — Telephone Encounter (Signed)
 Patient will be seen on 03/05/24.

## 2024-03-05 ENCOUNTER — Ambulatory Visit (INDEPENDENT_AMBULATORY_CARE_PROVIDER_SITE_OTHER): Payer: Medicaid Other | Admitting: Internal Medicine

## 2024-03-05 ENCOUNTER — Encounter: Payer: Self-pay | Admitting: Internal Medicine

## 2024-03-05 ENCOUNTER — Other Ambulatory Visit: Payer: Self-pay | Admitting: Internal Medicine

## 2024-03-05 VITALS — BP 120/86 | HR 98 | Resp 16 | Ht 70.5 in | Wt 274.0 lb

## 2024-03-05 DIAGNOSIS — Z1211 Encounter for screening for malignant neoplasm of colon: Secondary | ICD-10-CM

## 2024-03-05 DIAGNOSIS — Z0000 Encounter for general adult medical examination without abnormal findings: Secondary | ICD-10-CM

## 2024-03-05 DIAGNOSIS — Z124 Encounter for screening for malignant neoplasm of cervix: Secondary | ICD-10-CM

## 2024-03-05 DIAGNOSIS — H04129 Dry eye syndrome of unspecified lacrimal gland: Secondary | ICD-10-CM

## 2024-03-05 DIAGNOSIS — E119 Type 2 diabetes mellitus without complications: Secondary | ICD-10-CM | POA: Diagnosis not present

## 2024-03-05 DIAGNOSIS — Z1231 Encounter for screening mammogram for malignant neoplasm of breast: Secondary | ICD-10-CM

## 2024-03-05 DIAGNOSIS — H3530 Unspecified macular degeneration: Secondary | ICD-10-CM | POA: Insufficient documentation

## 2024-03-05 LAB — POCT WET PREP WITH KOH
Clue Cells Wet Prep HPF POC: NEGATIVE
KOH Prep POC: NEGATIVE
RBC Wet Prep HPF POC: NEGATIVE
Trichomonas, UA: NEGATIVE
Yeast Wet Prep HPF POC: NEGATIVE

## 2024-03-05 MED ORDER — EMPAGLIFLOZIN 10 MG PO TABS: 10.0000 mg | ORAL_TABLET | Freq: Every day | ORAL | 11 refills | Status: DC

## 2024-03-05 MED ORDER — TIRZEPATIDE 2.5 MG/0.5ML ~~LOC~~ SOAJ: 2.5000 mg | SUBCUTANEOUS | 11 refills | Status: DC

## 2024-03-05 MED ORDER — DEXCOM G6 TRANSMITTER MISC: 3 refills | Status: DC

## 2024-03-05 MED ORDER — DEXCOM G6 SENSOR MISC: 3 refills | Status: DC

## 2024-03-05 NOTE — Patient Instructions (Signed)
 Calcium Citrate (citracal)  500 to 600 mg of calcium twice a day.  Get with vitamin D or get Vitamin D3 1000 mg once daily.

## 2024-03-05 NOTE — Progress Notes (Signed)
 Subjective:    Patient ID: Diana Gray, female   DOB: [birth year], 48 y.o.   MRN: [id number]   HPI  CPE with pap  1.  Pap:  Last was about 3 years ago.  Always normal.    2.  Mammogram:  Last in [place] over 11 years ago--was done before her breast reduction in 2014.  No family history of breast cancer.      3.  Osteoprevention:  Little dairy intake.  Feels she is lactose intolerant-gas and diarrhea.  Works at [employer] facility on [address].  Also walks dog daily.    4.  Guaiac Cards/FIT:  Never.   5.  Colonoscopy:  Never.  No family history of colon cancer.    6.  Immunizations:  Has not had any COVID vaccines since first 2 in 2021.  Has not had influenza.  Has not had Tdap. Immunization History  Administered Date(s) Administered   [employer] Vaccination 12/27/2019, 01/24/2020     7.  Glucose/Cholesterol:  Fasting glucose 329 and admits to polydipsia and polyuria.  Cholesterol close to goal with LDL at 79 Lipid Panel     Component Value Date/Time   CHOL 150 02/28/2024 0842   TRIG 128 02/28/2024 0842   HDL 48 02/28/2024 0842   LDLCALC 79 02/28/2024 0842   LABVLDL 23 02/28/2024 0842      Current Meds  Medication Sig   azithromycin (ZITHROMAX) 250 MG tablet Take 2 tablets on day 1, then 1 tablet daily on days 2 through 5   fluticasone (FLONASE) 50 MCG/ACT nasal spray Place 2 sprays into both nostrils daily.   promethazine-dextromethorphan (PROMETHAZINE-DM) 6.25-15 MG/5ML syrup Take 5 mLs by mouth 4 (four) times daily as needed.   Allergies  Allergen Reactions   Latex    Past Medical History:  Diagnosis Date   Acute pelvic inflammatory disease (PID)    year uncertain.   Diabetes mellitus without complication (HCC) 02/2024   Right knee pain    Past Surgical History:  Procedure Laterality Date   BREAST SURGERY  2014   Reduction   DILATION AND CURETTAGE OF UTERUS  2013   Following right salpingectomy for ectopic pregnancy as continued to bleed.   ECTOPIC  PREGNANCY SURGERY Right 2013   OVARIAN CYST REMOVAL  2015   with left salpingectomy, assuming cyst removal on left   TUBAL LIGATION  2008   Family History  Problem Relation Age of Onset   Diabetes Mother    Diabetes Father    Heart failure Father    Cancer Sister        Unknown cancer   Diabetes Brother    Diabetes Daughter        since age 67, but seemed to resolve after childbirth and lost weight.   Allergies Son    Social History   Socioeconomic History   Marital status: Married    Spouse name: Anette Riedel   Number of children: 2   Years of education: Not on file   Highest education level: High school graduate  Occupational History   Occupation: chef    Comment: [employer]  Tobacco Use   Smoking status: Former    Current packs/day: 0.00    Average packs/day: 1.5 packs/day for 30.0 years (45.0 ttl pk-yrs)    Types: Cigarettes    Start date: 26    Quit date: 2022    Years since quitting: 3.2    Passive exposure: Never   Smokeless tobacco: Never  Substance and  Sexual Activity   Alcohol use: Yes    Comment: occasional.   Drug use: Not Currently    Types: Marijuana    Comment: last in 2024   Sexual activity: Yes    Birth control/protection: Post-menopausal  Other Topics Concern   Not on file  Social History Narrative   Lives at home with new husband and son.   Social Drivers of Corporate investment banker Strain: Low Risk  (03/05/2024)   Overall Financial Resource Strain (CARDIA)    Difficulty of Paying Living Expenses: Not very hard  Food Insecurity: No Food Insecurity (03/05/2024)   Hunger Vital Sign    Worried About Running Out of Food in the Last Year: Never true    Ran Out of Food in the Last Year: Never true  Transportation Needs: No Transportation Needs (03/05/2024)   PRAPARE - Administrator, Civil Service (Medical): No    Lack of Transportation (Non-Medical): No  Physical Activity: Not on file  Stress: Not on file  Social Connections: Not on  file  Intimate Partner Violence: Not At Risk (03/05/2024)   Humiliation, Afraid, Rape, and Kick questionnaire    Fear of Current or Ex-Partner: No    Emotionally Abused: No    Physically Abused: No    Sexually Abused: No     Review of Systems  HENT:  Positive for postnasal drip.   Eyes:  Negative for visual disturbance (Goes to Dr. Candi Chafe.).  Respiratory:  Positive for cough and shortness of breath.        Hoarseness  Cardiovascular:  Negative for chest pain.  Gastrointestinal:  Negative for abdominal pain and blood in stool (No melena).  Genitourinary:        Hot flashes      Objective:   BP 120/86 (BP Location: Right Arm, Patient Position: Sitting, Cuff Size: Normal)   Pulse 98   Resp 16   Ht 5' 10.5" (1.791 m)   Wt 274 lb (124.3 kg)   SpO2 98%   BMI 38.76 kg/m   Physical Exam Constitutional:      Appearance: She is obese.  HENT:     Head: Normocephalic and atraumatic.     Right Ear: Tympanic membrane, ear canal and external ear normal.     Left Ear: Tympanic membrane, ear canal and external ear normal.     Nose: Congestion present.     Mouth/Throat:     Mouth: Mucous membranes are moist.     Pharynx: Oropharynx is clear.  Eyes:     Extraocular Movements: Extraocular movements intact.     Conjunctiva/sclera: Conjunctivae normal.     Pupils: Pupils are equal, round, and reactive to light.  Neck:     Thyroid: Thyromegaly (mild) present.  Cardiovascular:     Rate and Rhythm: Normal rate and regular rhythm.     Pulses:          Dorsalis pedis pulses are 2+ on the right side and 2+ on the left side.       Posterior tibial pulses are 2+ on the right side and 2+ on the left side.     Heart sounds: S1 normal and S2 normal. No murmur heard.    No friction rub. No S3 or S4 sounds.     Comments: No carotid bruits.  Carotid, radial, femoral, DP and PT pulses normal and equal.   Pulmonary:     Effort: Pulmonary effort is normal.     Breath sounds:  Normal breath sounds  and air entry.  Chest:  Breasts:    Right: No inverted nipple, mass or nipple discharge.     Left: No inverted nipple, mass or nipple discharge.     Comments: Breast reduction scars Abdominal:     General: Bowel sounds are normal.     Palpations: Abdomen is soft. There is no hepatomegaly, splenomegaly or mass.     Tenderness: There is no abdominal tenderness.     Hernia: No hernia is present.  Genitourinary:    Comments: Normal external female genitalia No cervical lesion Thin white vaginal secretions. No uterine or adnexal mass or tenderness Musculoskeletal:        General: Normal range of motion.     Cervical back: Normal range of motion and neck supple.     Right lower leg: No edema.     Left lower leg: No edema.       Feet:  Feet:     Right foot:     Protective Sensation: 10 sites tested.  10 sites sensed.     Left foot:     Protective Sensation: 10 sites tested.  10 sites sensed.     Comments: Circled area dry, thickened and flaking. Left great toenail possibly thickened, though heavily painted Lymphadenopathy:     Head:     Right side of head: No submental or submandibular adenopathy.     Left side of head: No submental or submandibular adenopathy.     Cervical: No cervical adenopathy.     Upper Body:     Right upper body: No supraclavicular or axillary adenopathy.     Left upper body: No supraclavicular or axillary adenopathy.     Lower Body: No right inguinal adenopathy. No left inguinal adenopathy.  Skin:    General: Skin is warm.     Capillary Refill: Capillary refill takes less than 2 seconds.     Comments: Acne scarring on back  Neurological:     General: No focal deficit present.     Mental Status: She is alert and oriented to person, place, and time.     Cranial Nerves: Cranial nerves 2-12 are intact.     Sensory: Sensation is intact.     Motor: Motor function is intact.     Coordination: Coordination is intact.     Gait: Gait is intact.     Deep  Tendon Reflexes: Reflexes are normal and symmetric.  Psychiatric:        Speech: Speech normal.        Behavior: Behavior normal. Behavior is cooperative.      Assessment & Plan   CPE with pap Referral for screening colonoscopy Mammogram ordered. Meant to give Tdap today--will give on return in 2 weeks. Will also need Pneumococcal 20  2.  New Onset DM with obesity:  Mounjaro 2.5 mg injected once weekly.  Jardiance 10 mg daily.  Follow up with sugar journal in 2 weeks for weight check.  Dexcom monitoring ordered. Had eye check with Dr. Laruth Bouchard office this year.  3.  ?HSV history:  old records received from [facility] show ordering of HSV immunoglobulins, but no result.  Patient had previously given history of HSV 2, but states she never had any lesions when asked here.   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  LDL slightly high on no medication.  Recheck in 3 months with [facility]

## 2024-03-06 LAB — MICROALBUMIN / CREATININE URINE RATIO
Creatinine, Urine: 73.2 mg/dL
Microalb/Creat Ratio: 15 mg/g{creat} (ref 0–29)
Microalbumin, Urine: 11.2 ug/mL

## 2024-03-07 LAB — CYTOLOGY - PAP

## 2024-03-10 ENCOUNTER — Telehealth: Payer: Self-pay

## 2024-03-10 ENCOUNTER — Encounter: Payer: Self-pay | Admitting: Internal Medicine

## 2024-03-10 ENCOUNTER — Other Ambulatory Visit: Payer: Self-pay

## 2024-03-10 MED ORDER — METFORMIN HCL ER 500 MG PO TB24: ORAL_TABLET | ORAL | 11 refills | Status: AC

## 2024-03-10 MED ORDER — GLIPIZIDE 10 MG PO TABS: ORAL_TABLET | ORAL | 11 refills | Status: DC

## 2024-03-10 NOTE — Telephone Encounter (Signed)
 Patient has been notified to start Metformin  and Glipizide  while we get other meds sorted out.

## 2024-03-10 NOTE — Telephone Encounter (Signed)
 Patient had a glucose reading of 496 this morning at work. Patient has not yet started on DM medications as we are waiting for insurance approval. Patient wants to know if she should do something for DM treatment while she waits for medication.

## 2024-03-13 MED ORDER — LANCETS MISC. MISC: 11 refills | Status: DC

## 2024-03-13 MED ORDER — BLOOD GLUCOSE TEST VI STRP: ORAL_STRIP | 11 refills | Status: DC

## 2024-03-13 MED ORDER — BLOOD GLUCOSE MONITORING SUPPL DEVI: 0 refills | Status: DC

## 2024-03-13 MED ORDER — LANCET DEVICE MISC: 0 refills | Status: DC

## 2024-03-13 NOTE — Telephone Encounter (Signed)
 Glucose monitoring device and supplies have been sent to the pharmacy.

## 2024-03-13 NOTE — Telephone Encounter (Signed)
 Called the insurance and found that all injectables including ozempic require patient to have tried and failed metformin . Patient has been made aware. Patient would like a generic glucometer device to check blood sugar.

## 2024-03-13 NOTE — Addendum Note (Signed)
 Addended by: Edana Aguado on: 03/13/2024 04:10 PM   Modules accepted: Orders

## 2024-03-18 ENCOUNTER — Telehealth: Payer: Self-pay

## 2024-03-18 NOTE — Telephone Encounter (Signed)
 Called patient to remind her of her appointment that she has with the nurse for vaccines and to check weight.  I asked patient if she could bring the copy of her sugars that she has been noting down for the past 2 weeks ,   Patient raised her voice and stated " how am I suppose to, 2 weeks , okay BYE" and patient hung up.

## 2024-03-20 ENCOUNTER — Encounter: Payer: Self-pay | Admitting: Internal Medicine

## 2024-03-20 ENCOUNTER — Other Ambulatory Visit

## 2024-03-20 NOTE — Progress Notes (Signed)
 Patient has not taken Mounjaro due to insurance denial.   Will contact insurance for PA   PA denied; no weight checks needed    Per [facility] patient started medication on 03/10/2024 with high sugar levels  Due to medication sugar levels are 120s-170s   Continue medication as prescribed

## 2024-03-27 ENCOUNTER — Ambulatory Visit

## 2024-05-08 ENCOUNTER — Ambulatory Visit (AMBULATORY_SURGERY_CENTER)

## 2024-05-08 VITALS — Ht 70.5 in | Wt 274.0 lb

## 2024-05-08 DIAGNOSIS — Z1211 Encounter for screening for malignant neoplasm of colon: Secondary | ICD-10-CM

## 2024-05-08 MED ORDER — NA SULFATE-K SULFATE-MG SULF 17.5-3.13-1.6 GM/177ML PO SOLN: 1.0000 | Freq: Once | ORAL | 0 refills | Status: AC

## 2024-05-08 NOTE — Patient Instructions (Signed)
  ORAL DIABETIC MEDICATION INSTRUCTIONS Metformin  Glipizide  Jardiance  Glimepiride Farixga Januvia  Rybelsus, Xigduo, Sitagliptin, Synjardy Tradjenta Actos, Alogliptin Invokana  The day before your procedure: 7/2  Do not take your diabetic pill   The day of your procedure: 7/3  Do not take your diabetic pill   We will check your blood sugar levels during the admission process and again in Recovery before discharging you home  _____________________________________________________________________________   ONCE A WEEK INJECTIONS Ozempic,  Mounjaro , Wegovy, Trulicity, Tanzeum, Byetta, Victoza, Bydureon, & SymlinPen  -DO NOT TAKE 7 days prior to the procedure.  Last dose on or before Wednesday 6/25 failure to hold this medication will result in a cancellation or rescheduling of your procedure

## 2024-05-08 NOTE — Progress Notes (Signed)

## 2024-05-22 ENCOUNTER — Encounter: Payer: Self-pay | Admitting: Internal Medicine

## 2024-05-22 ENCOUNTER — Ambulatory Visit: Admitting: Internal Medicine

## 2024-05-22 VITALS — BP 124/85 | HR 63 | Temp 97.6°F | Resp 14 | Ht 70.5 in | Wt 274.0 lb

## 2024-05-22 DIAGNOSIS — K648 Other hemorrhoids: Secondary | ICD-10-CM | POA: Diagnosis not present

## 2024-05-22 DIAGNOSIS — D3A8 Other benign neuroendocrine tumors: Secondary | ICD-10-CM

## 2024-05-22 DIAGNOSIS — Z1211 Encounter for screening for malignant neoplasm of colon: Secondary | ICD-10-CM | POA: Diagnosis not present

## 2024-05-22 DIAGNOSIS — K635 Polyp of colon: Secondary | ICD-10-CM

## 2024-05-22 DIAGNOSIS — D125 Benign neoplasm of sigmoid colon: Secondary | ICD-10-CM

## 2024-05-22 DIAGNOSIS — K6389 Other specified diseases of intestine: Secondary | ICD-10-CM

## 2024-05-22 MED ORDER — SODIUM CHLORIDE 0.9 % IV SOLN: 500.0000 mL | Freq: Once | INTRAVENOUS | Status: DC

## 2024-05-22 NOTE — Op Note (Signed)
 [facility] Patient Name: Diana Gray Procedure Date: 05/22/2024 10:30 AM MRN: [id number] Endoscopist: Rosario Estefana Kidney , , [id number] Age: 48 Referring MD:  Date of Birth: March 11, 1976 Gender: Female Account #: [id number] Procedure:                Colonoscopy Indications:              Screening for colorectal malignant neoplasm, This                            is the patient's first colonoscopy Medicines:                Monitored Anesthesia Care Procedure:                Pre-Anesthesia Assessment:                           - Prior to the procedure, a History and Physical                            was performed, and patient medications and                            allergies were reviewed. The patient's tolerance of                            previous anesthesia was also reviewed. The risks                            and benefits of the procedure and the sedation                            options and risks were discussed with the patient.                            All questions were answered, and informed consent                            was obtained. Prior Anticoagulants: The patient has                            taken no anticoagulant or antiplatelet agents. ASA                            Grade Assessment: II - A patient with mild systemic                            disease. After reviewing the risks and benefits,                            the patient was deemed in satisfactory condition to                            undergo the procedure.  After obtaining informed consent, the colonoscope                            was passed under direct vision. Throughout the                            procedure, the patient's blood pressure, pulse, and                            oxygen saturations were monitored continuously. The                            CF [id number] #[id number] was introduced through the anus                            and advanced to  the the terminal ileum. The                            colonoscopy was performed without difficulty. The                            patient tolerated the procedure well. The quality                            of the bowel preparation was excellent. The                            terminal ileum, ileocecal valve, appendiceal                            orifice, and rectum were photographed. Scope In: 10:41:23 AM Scope Out: 11:02:53 AM Scope Withdrawal Time: 0 hours 17 minutes 52 seconds  Total Procedure Duration: 0 hours 21 minutes 30 seconds  Findings:                 The distal ileum contained one sessile,                            non-bleeding polyp. The polyp was 7 mm in diameter.                            The polyp was removed with a hot snare. Resection                            and retrieval were complete.                           A 4 mm polyp was found in the sigmoid colon. The                            polyp was sessile. The polyp was removed with a                            cold snare. Resection  and retrieval were complete.                           Non-bleeding internal hemorrhoids were found during                            retroflexion. Complications:            No immediate complications. Estimated Blood Loss:     Estimated blood loss was minimal. Impression:               - One polyp in the distal ileum, removed with a hot                            snare. Resected and retrieved.                           - One 4 mm polyp in the sigmoid colon, removed with                            a cold snare. Resected and retrieved.                           - Non-bleeding internal hemorrhoids. Recommendation:           - Discharge patient to home (with escort).                           - Await pathology results.                           - The findings and recommendations were discussed                            with the patient. Dr Estefana Federico Rosario Estefana Federico,   05/22/2024 11:06:00 AM

## 2024-05-22 NOTE — Progress Notes (Signed)
 Sedate, gd SR, tolerated procedure well, VSS, report to RN

## 2024-05-22 NOTE — Progress Notes (Signed)
 GASTROENTEROLOGY PROCEDURE H&P NOTE   Primary Care Physician: Adella Norris, MD    Reason for Procedure:   Colon cancer screening  Plan:    Colonoscopy  Patient is appropriate for endoscopic procedure(s) in the ambulatory ([facility]) setting.  The nature of the procedure, as well as the risks, benefits, and alternatives were carefully and thoroughly reviewed with the patient. Ample time for discussion and questions allowed. The patient understood, was satisfied, and agreed to proceed.     HPI: Diana Gray is a 48 y.o. female who presents for colonoscopy for colon cancer screening. Denies blood in stools, changes in bowel habits, or unintentional weight loss. Denies family history of colon cancer.  Past Medical History:  Diagnosis Date   Acute pelvic inflammatory disease (PID)    year uncertain.   Diabetes mellitus without complication (HCC) 02/2024   Right knee pain     Past Surgical History:  Procedure Laterality Date   BREAST SURGERY  2014   Reduction   DILATION AND CURETTAGE OF UTERUS  2013   Following right salpingectomy for ectopic pregnancy as continued to bleed.   ECTOPIC PREGNANCY SURGERY Right 2013   OVARIAN CYST REMOVAL  2015   with left salpingectomy, assuming cyst removal on left   TUBAL LIGATION  2008    Prior to Admission medications   Medication Sig Start Date End Date Taking? Authorizing Provider  Blood Glucose Monitoring Suppl DEVI May substitute to any manufacturer covered by patient's insurance. Check blood glucose level twice daily before meals. 03/13/24   Adella Norris, MD  Continuous Glucose Sensor (DEXCOM G6 SENSOR) MISC Check blood glucose at least twice daily before meals 03/05/24   Adella Norris, MD  Continuous Glucose Transmitter (DEXCOM G6 TRANSMITTER) MISC Check blood glucose at least twice daily before meals 03/05/24   Adella Norris, MD  empagliflozin  (JARDIANCE ) 10 MG TABS tablet Take 1 tablet (10 mg total) by mouth  daily before breakfast. 03/05/24   Adella Norris, MD  fluticasone  (FLONASE ) 50 MCG/ACT nasal spray Place 2 sprays into both nostrils daily. Patient not taking: Reported on 05/08/2024 03/03/24   Vivienne Delon HERO, PA-C  glipiZIDE  (GLUCOTROL ) 10 MG tablet 1 tab by mouth twice daily with meals 03/10/24   Adella Norris, MD  Glucose Blood (BLOOD GLUCOSE TEST STRIPS) STRP May substitute to any manufacturer covered by patient's insurance. Check blood glucose level twice daily before meals. 03/13/24   Adella Norris, MD  Lancet Device MISC May substitute to any manufacturer covered by patient's insurance. Check blood glucose level twice daily before meals. 03/13/24   Adella Norris, MD  Lancets Misc. MISC May substitute to any manufacturer covered by patient's insurance. Check blood glucose level twice daily before meals. 03/13/24   Adella Norris, MD  metFORMIN  (GLUCOPHAGE -XR) 500 MG 24 hr tablet 2 tabs by mouth twice daily with meals 03/10/24   Adella Norris, MD  tirzepatide  (MOUNJARO ) 2.5 MG/0.5ML Pen Inject 2.5 mg into the skin once a week. Patient not taking: Reported on 05/08/2024 03/05/24   Adella Norris, MD    Current Outpatient Medications  Medication Sig Dispense Refill   Blood Glucose Monitoring Suppl DEVI May substitute to any manufacturer covered by patient's insurance. Check blood glucose level twice daily before meals. 1 each 0   Continuous Glucose Sensor (DEXCOM G6 SENSOR) MISC Check blood glucose at least twice daily before meals 3 each 3   Continuous Glucose Transmitter (DEXCOM G6 TRANSMITTER) MISC Check blood glucose at least twice daily before meals 1 each 3  empagliflozin  (JARDIANCE ) 10 MG TABS tablet Take 1 tablet (10 mg total) by mouth daily before breakfast. 30 tablet 11   fluticasone  (FLONASE ) 50 MCG/ACT nasal spray Place 2 sprays into both nostrils daily. (Patient not taking: Reported on 05/08/2024) 16 g 0   glipiZIDE  (GLUCOTROL ) 10 MG tablet 1 tab  by mouth twice daily with meals 60 tablet 11   Glucose Blood (BLOOD GLUCOSE TEST STRIPS) STRP May substitute to any manufacturer covered by patient's insurance. Check blood glucose level twice daily before meals. 100 strip 11   Lancet Device MISC May substitute to any manufacturer covered by patient's insurance. Check blood glucose level twice daily before meals. 1 each 0   Lancets Misc. MISC May substitute to any manufacturer covered by patient's insurance. Check blood glucose level twice daily before meals. 100 each 11   metFORMIN  (GLUCOPHAGE -XR) 500 MG 24 hr tablet 2 tabs by mouth twice daily with meals 120 tablet 11   tirzepatide  (MOUNJARO ) 2.5 MG/0.5ML Pen Inject 2.5 mg into the skin once a week. (Patient not taking: Reported on 05/08/2024) 2 mL 11   Current Facility-Administered Medications  Medication Dose Route Frequency Provider Last Rate Last Admin   0.9 %  sodium chloride infusion  500 mL Intravenous Once Sedrick Tober C, MD        Allergies as of 05/22/2024 - Review Complete 05/08/2024  Allergen Reaction Noted   Latex  06/27/2019    Family History  Problem Relation Age of Onset   Diabetes Mother    Diabetes Father    Heart failure Father    Cancer Sister        Unknown cancer   Diabetes Brother    Diabetes Daughter        since age 76, but seemed to resolve after childbirth and lost weight.   Allergies Son    Colon cancer Neg Hx    Rectal cancer Neg Hx    Stomach cancer Neg Hx     Social History   Socioeconomic History   Marital status: Married    Spouse name: Devaughn   Number of children: 2   Years of education: Not on file   Highest education level: High school graduate  Occupational History   Occupation: chef    Comment: [employer]  Tobacco Use   Smoking status: Former    Current packs/day: 0.00    Average packs/day: 1.5 packs/day for 30.0 years (45.0 ttl pk-yrs)    Types: Cigarettes    Start date: 21    Quit date: 2022    Years since quitting: 3.5     Passive exposure: Never   Smokeless tobacco: Never  Substance and Sexual Activity   Alcohol use: Yes    Comment: occasional.   Drug use: Not Currently    Types: Marijuana    Comment: last in 2024   Sexual activity: Yes    Birth control/protection: Post-menopausal  Other Topics Concern   Not on file  Social History Narrative   Lives at home with new husband and son.   Social Drivers of Corporate investment banker Strain: Low Risk  (03/05/2024)   Overall Financial Resource Strain (CARDIA)    Difficulty of Paying Living Expenses: Not very hard  Food Insecurity: No Food Insecurity (03/05/2024)   Hunger Vital Sign    Worried About Running Out of Food in the Last Year: Never true    Ran Out of Food in the Last Year: Never true  Transportation Needs: No Transportation Needs (  03/05/2024)   PRAPARE - Administrator, Civil Service (Medical): No    Lack of Transportation (Non-Medical): No  Physical Activity: Not on file  Stress: Not on file  Social Connections: Not on file  Intimate Partner Violence: Not At Risk (03/05/2024)   Humiliation, Afraid, Rape, and Kick questionnaire    Fear of Current or Ex-Partner: No    Emotionally Abused: No    Physically Abused: No    Sexually Abused: No    Physical Exam: Vital signs in last 24 hours: BP 127/76   Pulse 68   Temp 97.6 F (36.4 C) (Temporal)   Ht 5' 10.5 (1.791 m)   Wt 274 lb (124.3 kg)   SpO2 100%   BMI 38.76 kg/m  GEN: NAD EYE: Sclerae anicteric ENT: MMM CV: Non-tachycardic Pulm: No increased work of breathing GI: Soft, NT/ND NEURO:  Alert & Oriented   Estefana Kidney, MD [facility]  05/22/2024 10:01 AM

## 2024-05-22 NOTE — Progress Notes (Signed)
 Called to room to assist during endoscopic procedure.  Patient ID and intended procedure confirmed with present staff. Received instructions for my participation in the procedure from the performing physician.

## 2024-05-22 NOTE — Patient Instructions (Signed)
 Handout provided for colon polyps  YOU HAD AN ENDOSCOPIC PROCEDURE TODAY AT THE [facility]:   Refer to the procedure report that was given to you for any specific questions about what was found during the examination.  If the procedure report does not answer your questions, please call your gastroenterologist to clarify.  If you requested that your care partner not be given the details of your procedure findings, then the procedure report has been included in a sealed envelope for you to review at your convenience later.  YOU SHOULD EXPECT: Some feelings of bloating in the abdomen. Passage of more gas than usual.  Walking can help get rid of the air that was put into your GI tract during the procedure and reduce the bloating. If you had a lower endoscopy (such as a colonoscopy or flexible sigmoidoscopy) you may notice spotting of blood in your stool or on the toilet paper. If you underwent a bowel prep for your procedure, you may not have a normal bowel movement for a few days.  Please Note:  You might notice some irritation and congestion in your nose or some drainage.  This is from the oxygen used during your procedure.  There is no need for concern and it should clear up in a day or so.  SYMPTOMS TO REPORT IMMEDIATELY:  Following lower endoscopy (colonoscopy or flexible sigmoidoscopy):  Excessive amounts of blood in the stool  Significant tenderness or worsening of abdominal pains  Swelling of the abdomen that is new, acute  Fever of 100F or higher  For urgent or emergent issues, a gastroenterologist can be reached at any hour by calling [phone]. Do not use MyChart messaging for urgent concerns.    DIET:  We do recommend a small meal at first, but then you may proceed to your regular diet.  Drink plenty of fluids but you should avoid alcoholic beverages for 24 hours.  ACTIVITY:  You should plan to take it easy for the rest of today and you should NOT DRIVE or use heavy  machinery until tomorrow (because of the sedation medicines used during the test).    FOLLOW UP: Our staff will call the number listed on your records the next business day following your procedure.  We will call around 7:15- 8:00 am to check on you and address any questions or concerns that you may have regarding the information given to you following your procedure. If we do not reach you, we will leave a message.     If any biopsies were taken you will be contacted by phone or by letter within the next 1-3 weeks.  Please call us  at [phone] if you have not heard about the biopsies in 3 weeks.    SIGNATURES/CONFIDENTIALITY: You and/or your care partner have signed paperwork which will be entered into your electronic medical record.  These signatures attest to the fact that that the information above on your After Visit Summary has been reviewed and is understood.  Full responsibility of the confidentiality of this discharge information lies with you and/or your care-partner.

## 2024-05-22 NOTE — Progress Notes (Signed)
 Pt's states no medical or surgical changes since previsit or office visit.

## 2024-05-26 ENCOUNTER — Telehealth: Payer: Self-pay | Admitting: *Deleted

## 2024-05-26 NOTE — Telephone Encounter (Signed)
  Follow up Call-     05/22/2024    9:56 AM  Call back number  Post procedure Call Back phone  # [phone]  Permission to leave phone message Yes    Left message to call back if any questions or concerns

## 2024-05-29 ENCOUNTER — Telehealth: Payer: Self-pay

## 2024-05-29 ENCOUNTER — Ambulatory Visit: Payer: Self-pay | Admitting: Internal Medicine

## 2024-05-29 ENCOUNTER — Other Ambulatory Visit: Payer: Self-pay

## 2024-05-29 DIAGNOSIS — D3A8 Other benign neuroendocrine tumors: Secondary | ICD-10-CM

## 2024-05-29 LAB — SURGICAL PATHOLOGY

## 2024-05-29 NOTE — Telephone Encounter (Signed)
 Received call from Dr. Legolvan with pathology & he wanted to make Dr. 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 Report will be available soon for Dr. Federico to review, but he wanted her to be aware as this may have been an unexpected finding.

## 2024-05-29 NOTE — Progress Notes (Signed)
 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  Her other colon polyp was benign.  I would recommend further evaluation with CT chest/abdomen/pelvis with contrast to see if her neuroendocrine tumor has spread to any other areas of her body.  Once the results of her CT scan are back, we will likely plan to refer her to colorectal surgery for consideration of surgical resection of that area of her small bowel.    Pod B triage, please get her scheduled for expedited CT C/A/P with contrast. Recall for next colonoscopy can be in 10 years. Thank you

## 2024-06-05 ENCOUNTER — Other Ambulatory Visit (HOSPITAL_COMMUNITY)

## 2024-06-10 ENCOUNTER — Ambulatory Visit (HOSPITAL_COMMUNITY)
Admission: RE | Admit: 2024-06-10 | Discharge: 2024-06-10 | Disposition: A | Discharge status: Home / Self Care | Source: Ambulatory Visit | Attending: Internal Medicine | Admitting: Internal Medicine

## 2024-06-10 DIAGNOSIS — D3A8 Other benign neuroendocrine tumors: Secondary | ICD-10-CM | POA: Diagnosis present

## 2024-06-10 LAB — POCT I-STAT CREATININE: Creatinine, Ser: 0.8 mg/dL (ref 0.44–1.00)

## 2024-06-10 MED ORDER — IOHEXOL 300 MG/ML  SOLN
100.0000 mL | Freq: Once | INTRAMUSCULAR | Status: AC | PRN
  Administered 2024-06-10: 100 mL via INTRAVENOUS

## 2024-06-12 ENCOUNTER — Other Ambulatory Visit

## 2024-06-13 ENCOUNTER — Other Ambulatory Visit

## 2024-06-13 DIAGNOSIS — E669 Obesity, unspecified: Secondary | ICD-10-CM | POA: Diagnosis not present

## 2024-06-13 DIAGNOSIS — E119 Type 2 diabetes mellitus without complications: Secondary | ICD-10-CM

## 2024-06-14 ENCOUNTER — Ambulatory Visit: Payer: Self-pay | Admitting: Internal Medicine

## 2024-06-14 LAB — LIPID PANEL W/O CHOL/HDL RATIO
Cholesterol, Total: 147 mg/dL (ref 100–199)
HDL: 52 mg/dL (ref >39)
LDL Chol Calc (NIH): 80 mg/dL (ref 0–99)
Triglycerides: 74 mg/dL (ref 0–149)
VLDL Cholesterol Cal: 15 mg/dL (ref 5–40)

## 2024-06-14 LAB — HEMOGLOBIN A1C
Est. average glucose Bld gHb Est-mCnc: 186 mg/dL
Hgb A1c MFr Bld: 8.1 % — ABNORMAL HIGH (ref 4.8–5.6)

## 2024-06-16 ENCOUNTER — Ambulatory Visit: Payer: Self-pay | Admitting: Internal Medicine

## 2024-06-18 ENCOUNTER — Encounter: Payer: Self-pay | Admitting: Internal Medicine

## 2024-06-18 ENCOUNTER — Ambulatory Visit: Admitting: Internal Medicine

## 2024-06-18 VITALS — BP 119/79 | HR 80 | Resp 18 | Ht 72.0 in | Wt 280.0 lb

## 2024-06-18 DIAGNOSIS — E669 Obesity, unspecified: Secondary | ICD-10-CM

## 2024-06-18 DIAGNOSIS — E7800 Pure hypercholesterolemia, unspecified: Secondary | ICD-10-CM

## 2024-06-18 DIAGNOSIS — E119 Type 2 diabetes mellitus without complications: Secondary | ICD-10-CM

## 2024-06-18 MED ORDER — OZEMPIC (0.25 OR 0.5 MG/DOSE) 2 MG/3ML ~~LOC~~ SOPN
PEN_INJECTOR | SUBCUTANEOUS | 11 refills | Status: AC
Start: 2024-06-18 — End: ?

## 2024-06-18 MED ORDER — BLOOD GLUCOSE MONITORING SUPPL DEVI: 0 refills | Status: AC

## 2024-06-18 MED ORDER — LANCETS MISC. MISC: 11 refills | Status: AC

## 2024-06-18 MED ORDER — LANCET DEVICE MISC: 0 refills | Status: AC

## 2024-06-18 MED ORDER — BLOOD GLUCOSE TEST VI STRP: ORAL_STRIP | 11 refills | Status: AC

## 2024-06-18 NOTE — Progress Notes (Signed)
    Subjective:    Patient ID: Diana Gray, female   DOB: Mar 22, 1976, 48 y.o.   MRN: [id number]   HPI   DM/obesity:  Felt she had increased craving for sweets with combination of Jardiance , Metformin , and glipizide .  She was gaining weight and first came off of Metformin  and then glipizide  and then 2 weeks ago, stopped the Jardiance .  A1C high at 8.1%  2.  Elevated LDL in patient with DM:  LDL remains slightly high Lipid Panel     Component Value Date/Time   CHOL 147 06/13/2024 1053   TRIG 74 06/13/2024 1053   HDL 52 06/13/2024 1053   LDLCALC 80 06/13/2024 1053   LABVLDL 15 06/13/2024 1053     No outpatient medications have been marked as taking for the 06/18/24 encounter (Office Visit) with Adella Norris, MD.   Allergies  Allergen Reactions   Latex Other (See Comments)    Burns skin per pt.     Review of Systems    Objective:   BP 119/79 (Patient Position: Sitting)   Pulse 80   Resp 18   Ht 6' (1.829 m)   Wt 280 lb (127 kg)   LMP 11/20/2013 (Approximate)   BMI 37.97 kg/m   Physical Exam NAD Lungs:  CTA CV:  RRR without murmur or rub  Radial and DP pulses normal and equal LE:  No edema     Assessment & Plan   DM:  Stopped all meds including Jardiance , Glipizide , Metformin  as felt it caused her to crave more sweets and was gaining weight.  Sugar down, but still A1C not at goal.  Never obtained Mounjaro .  Ozempic  start at 0.25 mg weekly.  Hopefully, will also be able to add back Jardiance  or Farxiga if appetite decreases with Ozempic .    2.  Obesity:  worsening:  as above.  3.  Elevated LDL:  hold on statin until settled with DM treatment.

## 2024-06-25 ENCOUNTER — Other Ambulatory Visit: Payer: Self-pay

## 2024-07-02 NOTE — Progress Notes (Signed)
 The proposed treatment discussed in conference is for discussion purpose only and is not a binding recommendation.  The patients have not been physically examined, or presented with their treatment options.  Therefore, final treatment plans cannot be decided.

## 2024-08-07 ENCOUNTER — Encounter: Payer: Self-pay | Admitting: Physician Assistant

## 2024-08-07 ENCOUNTER — Telehealth: Admitting: Physician Assistant

## 2024-08-07 DIAGNOSIS — J069 Acute upper respiratory infection, unspecified: Secondary | ICD-10-CM

## 2024-08-07 MED ORDER — PSEUDOEPH-BROMPHEN-DM 30-2-10 MG/5ML PO SYRP: 5.0000 mL | ORAL_SOLUTION | Freq: Four times a day (QID) | ORAL | 0 refills | Status: AC | PRN

## 2024-08-07 MED ORDER — AZELASTINE HCL 0.1 % NA SOLN: 2.0000 | Freq: Two times a day (BID) | NASAL | 12 refills | Status: AC

## 2024-08-07 NOTE — Progress Notes (Signed)
 Virtual Visit Consent   Diana Gray, you are scheduled for a virtual visit with a [facility] provider today. Just as with appointments in the office, your consent must be obtained to participate. Your consent will be active for this visit and any virtual visit you may have with one of our providers in the next 365 days. If you have a MyChart account, a copy of this consent can be sent to you electronically.  As this is a virtual visit, video technology does not allow for your provider to perform a traditional examination. This may limit your provider's ability to fully assess your condition. If your provider identifies any concerns that need to be evaluated in person or the need to arrange testing (such as labs, EKG, etc.), we will make arrangements to do so. Although advances in technology are sophisticated, we cannot ensure that it will always work on either your end or our end. If the connection with a video visit is poor, the visit may have to be switched to a telephone visit. With either a video or telephone visit, we are not always able to ensure that we have a secure connection.  By engaging in this virtual visit, you consent to the provision of healthcare and authorize for your insurance to be billed (if applicable) for the services provided during this visit. Depending on your insurance coverage, you may receive a charge related to this service.  I need to obtain your verbal consent now. Are you willing to proceed with your visit today? Diana Gray has provided verbal consent on 08/07/2024 for a virtual visit (video or telephone). [name], [place]  Date: 08/07/2024 8:56 AM   Virtual Visit via Video Note   I, [name], connected with  Diana Gray  ([id number], 1976/02/21) on 08/07/24 at  8:45 AM EDT by a video-enabled telemedicine application and verified that I am speaking with the correct person using two identifiers.  Location: Patient: Virtual Visit Location Patient:  Home Provider: Virtual Visit Location Provider: Home Office   I discussed the limitations of evaluation and management by telemedicine and the availability of in person appointments. The patient expressed understanding and agreed to proceed.    History of Present Illness: Diana Gray is a 48 y.o. who identifies as a female who was assigned female at birth, and is being seen today for URI.   HPI: URI  This is a new problem. The current episode started in the past 7 days. The problem has been unchanged. There has been no fever. Associated symptoms include headaches, rhinorrhea, sinus pain and a sore throat. The treatment provided no relief.    Problems:  Patient Active Problem List   Diagnosis Date Noted   Elevated LDL cholesterol level 06/18/2024   Dry eyes due to decreased tear production 03/05/2024   Macular degeneration of right eye 03/05/2024   Type 2 diabetes mellitus without complication, without long-term current use of insulin (HCC) 02/2024   Obesity (BMI 35.0-39.9 without comorbidity) 11/19/2023   Right knee pain     Allergies:  Allergies  Allergen Reactions   Latex Other (See Comments)    Burns skin per pt.   Medications:  Current Outpatient Medications:    Blood Glucose Monitoring Suppl DEVI, May substitute to any manufacturer covered by patient's insurance. Check blood glucose level twice daily before meals., Disp: 1 each, Rfl: 0   Glucose Blood (BLOOD GLUCOSE TEST STRIPS) STRP, May substitute to any manufacturer covered by patient's insurance. Check blood glucose level twice  daily before meals., Disp: 100 strip, Rfl: 11   Lancet Device MISC, May substitute to any manufacturer covered by patient's insurance. Check blood glucose level twice daily before meals., Disp: 1 each, Rfl: 0   Lancets Misc. MISC, May substitute to any manufacturer covered by patient's insurance. Check blood glucose level twice daily before meals., Disp: 100 each, Rfl: 11   metFORMIN   (GLUCOPHAGE -XR) 500 MG 24 hr tablet, 2 tabs by mouth twice daily with meals (Patient not taking: Reported on 06/18/2024), Disp: 120 tablet, Rfl: 11   Semaglutide ,0.25 or 0.5MG /DOS, (OZEMPIC , 0.25 OR 0.5 MG/DOSE,) 2 MG/3ML SOPN, Inject 0.25 mg once weekly, Disp: 3 mL, Rfl: 11  Observations/Objective: Patient is well-developed, well-nourished in no acute distress.  Resting comfortably  at home.  Head is normocephalic, atraumatic.  No labored breathing. Speech is clear and coherent with logical content.  Patient is alert and oriented at baseline.    Assessment and Plan: 1. Upper respiratory tract infection, unspecified type (Primary)  Patient presenting with URI. Differentials include allergic rhinitis, COVID,  bacterial pneumonia, sinusitis. Do not suspect underlying cardiopulmonary process. I considered, but think unlikely, dangerous causes of this patient's symptoms to include ACS, CHF or pneumonia, pneumothorax. Patient is nontoxic and not in need of emergent medical intervention.Patient states home covid test is negative.   Plan: reassurance, reassessment, over the counter medications, discharge with PCP follow-up  Follow Up Instructions: I discussed the assessment and treatment plan with the patient. The patient was provided an opportunity to ask questions and all were answered. The patient agreed with the plan and demonstrated an understanding of the instructions.  A copy of instructions were sent to the patient via MyChart unless otherwise noted below.    The patient was advised to call back or seek an in-person evaluation if the symptoms worsen or if the condition fails to improve as anticipated.    [name], PA-C

## 2024-08-07 NOTE — Patient Instructions (Signed)
  Diana Gray, thank you for joining Diana Shuck, PA-C for today's virtual visit.  While this provider is not your primary care provider (PCP), if your PCP is located in our provider database this encounter information will be shared with them immediately following your visit.   A [facility] MyChart account gives you access to today's visit and all your visits, tests, and labs performed at [facility]  click here if you don't have a [name] account or go to [email or web address]  Consent: (Patient) Diana Gray provided verbal consent for this virtual visit at the beginning of the encounter.  Current Medications:  Current Outpatient Medications:    Blood Glucose Monitoring Suppl DEVI, May substitute to any manufacturer covered by patient's insurance. Check blood glucose level twice daily before meals., Disp: 1 each, Rfl: 0   Glucose Blood (BLOOD GLUCOSE TEST STRIPS) STRP, May substitute to any manufacturer covered by patient's insurance. Check blood glucose level twice daily before meals., Disp: 100 strip, Rfl: 11   Lancet Device MISC, May substitute to any manufacturer covered by patient's insurance. Check blood glucose level twice daily before meals., Disp: 1 each, Rfl: 0   Lancets Misc. MISC, May substitute to any manufacturer covered by patient's insurance. Check blood glucose level twice daily before meals., Disp: 100 each, Rfl: 11   metFORMIN  (GLUCOPHAGE -XR) 500 MG 24 hr tablet, 2 tabs by mouth twice daily with meals (Patient not taking: Reported on 06/18/2024), Disp: 120 tablet, Rfl: 11   Semaglutide ,0.25 or 0.5MG /DOS, (OZEMPIC , 0.25 OR 0.5 MG/DOSE,) 2 MG/3ML SOPN, Inject 0.25 mg once weekly, Disp: 3 mL, Rfl: 11   Medications ordered in this encounter:  No orders of the defined types were placed in this encounter.    *If you need refills on other medications prior to your next appointment, please contact your pharmacy*  Follow-Up: Call back or seek  an in-person evaluation if the symptoms worsen or if the condition fails to improve as anticipated.  [facility] [phone]  Other Instructions Please report to the nearest Emergency room with any worsening symptoms. Follow up with primary care provider (PCP) in 2 -3 days.    If you have been instructed to have an in-person evaluation today at a local Urgent Care facility, please use the link below. It will take you to a list of all of our available [facility], including address, phone number and hours of operation. Please do not delay care.  [facility]  If you or a family member do not have a primary care provider, use the link below to schedule a visit and establish care. When you choose a [facility] primary care physician or advanced practice provider, you gain a long-term partner in health. Find a Primary Care Provider  Learn more about [facility] in-office and virtual care options: [facility] - Get Care Now

## 2024-11-06 ENCOUNTER — Telehealth: Payer: Self-pay | Admitting: Internal Medicine

## 2024-11-06 NOTE — Telephone Encounter (Signed)
 Inbound call from [place] with the [facility] stating that they are needing this patient colonoscopy report and pathology report. A good fax number is [phone] and a good contact number is [phone]. Please advise.

## 2024-11-06 NOTE — Telephone Encounter (Signed)
 Copy of both colon & path report have been faxed to number provided.
